# Patient Record
Sex: Female | Born: 1970
Health system: Southern US, Community
[De-identification: ages and names within clinical notes are randomized; demographics above are authoritative.]

## PROBLEM LIST (undated history)

## (undated) DIAGNOSIS — K219 Gastro-esophageal reflux disease without esophagitis: Secondary | ICD-10-CM

## (undated) DIAGNOSIS — IMO0002 Reserved for concepts with insufficient information to code with codable children: Secondary | ICD-10-CM

## (undated) DIAGNOSIS — Q2112 Patent foramen ovale: Secondary | ICD-10-CM

## (undated) DIAGNOSIS — L309 Dermatitis, unspecified: Secondary | ICD-10-CM

## (undated) DIAGNOSIS — R51 Headache: Secondary | ICD-10-CM

## (undated) DIAGNOSIS — B977 Papillomavirus as the cause of diseases classified elsewhere: Secondary | ICD-10-CM

## (undated) DIAGNOSIS — R519 Headache, unspecified: Secondary | ICD-10-CM

## (undated) DIAGNOSIS — Q211 Atrial septal defect: Secondary | ICD-10-CM

## (undated) DIAGNOSIS — E785 Hyperlipidemia, unspecified: Secondary | ICD-10-CM

## (undated) DIAGNOSIS — G8929 Other chronic pain: Secondary | ICD-10-CM

## (undated) DIAGNOSIS — G2581 Restless legs syndrome: Secondary | ICD-10-CM

## (undated) HISTORY — DX: Headache: R51

## (undated) HISTORY — DX: Hyperlipidemia, unspecified: E78.5

## (undated) HISTORY — DX: Other chronic pain: G89.29

## (undated) HISTORY — PX: UPPER GI ENDOSCOPY: SHX6162

## (undated) HISTORY — PX: COLPOSCOPY: SHX161

## (undated) HISTORY — PX: DENTAL SURGERY: SHX609

## (undated) HISTORY — DX: Gastro-esophageal reflux disease without esophagitis: K21.9

## (undated) HISTORY — PX: DOPPLER ECHOCARDIOGRAPHY: SHX263

## (undated) HISTORY — DX: Reserved for concepts with insufficient information to code with codable children: IMO0002

## (undated) HISTORY — DX: Restless legs syndrome: G25.81

## (undated) HISTORY — DX: Dermatitis, unspecified: L30.9

## (undated) HISTORY — DX: Papillomavirus as the cause of diseases classified elsewhere: B97.7

---
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
## 1998-07-28 ENCOUNTER — Other Ambulatory Visit: Admission: RE | Admit: 1998-07-28 | Discharge: 1998-07-28 | Payer: Self-pay | Admitting: Gynecology

## 1999-01-20 ENCOUNTER — Other Ambulatory Visit: Admission: RE | Admit: 1999-01-20 | Discharge: 1999-01-20 | Payer: Self-pay | Admitting: Gynecology

## 2000-03-27 ENCOUNTER — Other Ambulatory Visit: Admission: RE | Admit: 2000-03-27 | Discharge: 2000-03-27 | Payer: Self-pay | Admitting: Gynecology

## 2001-05-09 ENCOUNTER — Emergency Department (HOSPITAL_COMMUNITY): Admission: EM | Admit: 2001-05-09 | Discharge: 2001-05-09 | Payer: Self-pay

## 2002-07-29 ENCOUNTER — Other Ambulatory Visit: Admission: RE | Admit: 2002-07-29 | Discharge: 2002-07-29 | Payer: Self-pay | Admitting: Gynecology

## 2005-03-16 ENCOUNTER — Other Ambulatory Visit: Admission: RE | Admit: 2005-03-16 | Discharge: 2005-03-16 | Payer: Self-pay | Admitting: Gynecology

## 2005-04-11 HISTORY — PX: ENDOMETRIAL ABLATION: SHX621

## 2005-04-22 ENCOUNTER — Ambulatory Visit (HOSPITAL_COMMUNITY): Admission: RE | Admit: 2005-04-22 | Discharge: 2005-04-22 | Payer: Self-pay | Admitting: Gynecology

## 2006-05-12 ENCOUNTER — Other Ambulatory Visit: Admission: RE | Admit: 2006-05-12 | Discharge: 2006-05-12 | Payer: Self-pay | Admitting: Gynecology

## 2007-05-14 ENCOUNTER — Other Ambulatory Visit: Admission: RE | Admit: 2007-05-14 | Discharge: 2007-05-14 | Payer: Self-pay | Admitting: Gynecology

## 2008-05-21 ENCOUNTER — Ambulatory Visit: Payer: Self-pay | Admitting: Gynecology

## 2008-06-12 ENCOUNTER — Other Ambulatory Visit: Admission: RE | Admit: 2008-06-12 | Discharge: 2008-06-12 | Payer: Self-pay | Admitting: Gynecology

## 2008-06-12 ENCOUNTER — Encounter: Payer: Self-pay | Admitting: Gynecology

## 2008-06-12 ENCOUNTER — Ambulatory Visit: Payer: Self-pay | Admitting: Gynecology

## 2009-01-29 ENCOUNTER — Ambulatory Visit (HOSPITAL_COMMUNITY): Admission: RE | Admit: 2009-01-29 | Discharge: 2009-01-29 | Payer: Self-pay | Admitting: Internal Medicine

## 2009-02-02 ENCOUNTER — Encounter: Payer: Self-pay | Admitting: Cardiology

## 2009-02-05 ENCOUNTER — Ambulatory Visit: Payer: Self-pay | Admitting: Cardiology

## 2009-02-05 DIAGNOSIS — E663 Overweight: Secondary | ICD-10-CM | POA: Insufficient documentation

## 2009-02-05 DIAGNOSIS — R0602 Shortness of breath: Secondary | ICD-10-CM

## 2009-02-07 ENCOUNTER — Encounter: Admission: RE | Admit: 2009-02-07 | Discharge: 2009-02-07 | Payer: Self-pay | Admitting: Internal Medicine

## 2009-02-17 ENCOUNTER — Ambulatory Visit (HOSPITAL_COMMUNITY): Admission: RE | Admit: 2009-02-17 | Discharge: 2009-02-17 | Payer: Self-pay | Admitting: Cardiology

## 2009-02-17 ENCOUNTER — Encounter: Payer: Self-pay | Admitting: Cardiology

## 2009-02-17 ENCOUNTER — Ambulatory Visit: Payer: Self-pay | Admitting: Internal Medicine

## 2009-03-31 ENCOUNTER — Ambulatory Visit (HOSPITAL_COMMUNITY): Admission: RE | Admit: 2009-03-31 | Discharge: 2009-03-31 | Payer: Self-pay | Admitting: Diagnostic Neuroimaging

## 2009-03-31 ENCOUNTER — Ambulatory Visit: Payer: Self-pay | Admitting: Cardiology

## 2009-03-31 ENCOUNTER — Ambulatory Visit: Payer: Self-pay

## 2009-03-31 ENCOUNTER — Encounter (INDEPENDENT_AMBULATORY_CARE_PROVIDER_SITE_OTHER): Payer: Self-pay | Admitting: Diagnostic Neuroimaging

## 2009-06-16 ENCOUNTER — Other Ambulatory Visit: Admission: RE | Admit: 2009-06-16 | Discharge: 2009-06-16 | Payer: Self-pay | Admitting: Gynecology

## 2009-06-16 ENCOUNTER — Ambulatory Visit: Payer: Self-pay | Admitting: Gynecology

## 2010-06-18 ENCOUNTER — Other Ambulatory Visit: Payer: Self-pay | Admitting: Gynecology

## 2010-06-18 ENCOUNTER — Other Ambulatory Visit (HOSPITAL_COMMUNITY)
Admission: RE | Admit: 2010-06-18 | Discharge: 2010-06-18 | Disposition: A | Discharge status: Home / Self Care | Payer: 59 | Source: Ambulatory Visit | Attending: Gynecology | Admitting: Gynecology

## 2010-06-18 ENCOUNTER — Encounter (INDEPENDENT_AMBULATORY_CARE_PROVIDER_SITE_OTHER): Payer: 59 | Admitting: Gynecology

## 2010-06-18 DIAGNOSIS — Z124 Encounter for screening for malignant neoplasm of cervix: Secondary | ICD-10-CM | POA: Insufficient documentation

## 2010-06-18 DIAGNOSIS — Z01419 Encounter for gynecological examination (general) (routine) without abnormal findings: Secondary | ICD-10-CM

## 2010-08-27 NOTE — Op Note (Signed)
Kathryn Larsen, Kathryn Larsen              ACCOUNT NO.:  [id number]   MEDICAL RECORD NO.:  [id number]          PATIENT TYPE:  AMB   LOCATION:  SDC                           FACILITY:  [facility]   PHYSICIAN:  Timothy P. Fontaine, M.D.DATE OF BIRTH:  May 11, 1970   DATE OF PROCEDURE:  04/22/2005  DATE OF DISCHARGE:                                 OPERATIVE REPORT   PREOPERATIVE DIAGNOSIS:  Menorrhagia.   POSTOPERATIVE DIAGNOSIS:  Menorrhagia.   PROCEDURES:  1.  NovaSure endometrial ablation.  2.  Hysteroscopy.  3.  Paracervical block with 1% lidocaine.   SURGEON:  Timothy P. Fontaine, M.D.   ANESTHESIA:  MAC with 1% lidocaine paracervical block.   SPECIMEN:  None.   ESTIMATED BLOOD LOSS:  Minimal.   SORBITOL DISCREPANCY:  Minimal.   COMPLICATIONS:  None.   FINDINGS:  Postablative hysteroscopy shows the cavity to be normal, good  distension, no evidence of perforation, with a uniform ablation.   PROCEDURE:  The patient was taken to the operating room, underwent MAC  anesthesia, was placed in the low dorsal lithotomy position, received a  perineal and vaginal preparation with Betadine solution per nursing  personnel, and the bladder was emptied with in-and-out Foley  catheterization.  EUA was performed.  The patient was draped in the usual  fashion.  The cervix visualized with the speculum, anterior lip grasped with  a single-tooth tenaculum and a paracervical block using 1% lidocaine was  placed with a total of 10 mL.  The uterus was then sounded to 7.5 cm.  The  endocervical canal length was determined at 3 cm and the cervix was gently  dilated to admit the NovaSure endometrial ablator.  The NovaSure was placed,  the array opened, width determined at approximately 3.2 cm.  Manipulation  was performed to assure proper placement.  The carbon dioxide tester sleeve  was advanced, applied to the cervix, and the carbon dioxide test performed.  Subsequently the endometrial ablation was  performed without difficulty and  the instrument was subsequently closed and removed.  Hysteroscopy was then  performed showing a  normal empty cavity, good distension, no evidence of perforation, with a  uniform endometrial ablation.  Instruments were removed.  Adequate  hemostasis visualized.  The patient placed in supine position, awakened  without difficulty, taken to the recovery room in good condition, having  tolerated procedure the well.      Timothy P. Fontaine, M.D.  Electronically Signed     TPF/MEDQ  D:  04/22/2005  T:  04/22/2005  Job:  [id number]

## 2010-08-27 NOTE — H&P (Signed)
Kathryn Larsen, Kathryn Larsen              ACCOUNT NO.:  [id number]   MEDICAL RECORD NO.:  [id number]          PATIENT TYPE:  AMB   LOCATION:  SDC                           [facility]ACILITY:  [facility]   PHYSICIAN:  Timothy P. Fontaine, M.D.DATE OF BIRTH:  [birth year]   DATE OF ADMISSION:  DATE OF DISCHARGE:                                HISTORY & PHYSICAL   CHIEF COMPLAINT:  Menorrhagia.   HISTORY OF PRESENT ILLNESS:  This is a 40 year old G2, P2 female, vasectomy  birth control, who has increasing menorrhagia for endometrial ablation.  Preoperative evaluation includes a negative sonohysterogram and negative  thyroid functions.   PAST MEDICAL HISTORY:  Uncomplicated.   PAST SURGICAL HISTORY:  Cesarean section.   ALLERGIES:  No medication allergies.   REVIEW OF SYSTEMS:  Noncontributory.   FAMILY HISTORY:  Noncontributory.   SOCIAL HISTORY:  Noncontributory.   PHYSICAL EXAMINATION:  VITAL SIGNS:  Afebrile.  Vital signs are stable.  HEENT:  Normal.  LUNGS:  Clear.  CARDIAC:  Regular rate without rubs, murmurs, or gallops.  ABDOMEN:  Benign.  PELVIC:  External BUS, vagina normal.  Cervix normal.  Uterus normal size,  midline, immobile.  Adnexa without masses or tenderness.   ASSESSMENT:  A 40 year old G2, P2 female, vasectomy birth control,  increasing menorrhagia, for endometrial ablation.  I reviewed with the  patient and her husband what is involved with the procedure.  She  understands the need for absolute irreversible sterility.  She should never  achieve pregnancy following this and since she is using her husband's  vasectomy, if she is exposed to a different partner, that she should assure  absolute contraception.  She also understands that this is machine  dependent, if there is a malfunction in the machine, that we may not be able  to complete the ablation at this time.  Long term issues with ablation were  reviewed.  She understands she must continue routine follow-ups for  cervical  disease as well as endometrial carcinoma monitoring.  She understands there  are no guarantees of menorrhagia relief.  Her bleeding may continue, change,  or worsen following the procedure and she understands and accepts this.  I  reviewed what is involved with the procedure to include the expected  intraoperative and postoperative courses, the risks of infection, prolonged  antibiotics, the risk of hemorrhage, necessitating transfusion, and the  risks of transfusion were discussed, understood, and accepted.  The risks of  internal organ damage, both directly with perforation or thermally from the  instrument, to include bowel,  bladder, ureters, vessels, and nerves, necessitating major exploratory  reparative surgeries and future reparative surgeries, including ostomy  formation, was all discussed, understood, and accepted.  The patient's  questions were answered to her satisfaction and she is ready to proceed with  surgery.      Timothy P. Fontaine, M.D.  Electronically Signed     TPF/MEDQ  D:  04/18/2005  T:  04/18/2005  Job:  [id number]

## 2011-01-27 ENCOUNTER — Encounter: Payer: Self-pay | Admitting: Anesthesiology

## 2011-01-27 DIAGNOSIS — K219 Gastro-esophageal reflux disease without esophagitis: Secondary | ICD-10-CM | POA: Insufficient documentation

## 2011-01-31 ENCOUNTER — Ambulatory Visit (INDEPENDENT_AMBULATORY_CARE_PROVIDER_SITE_OTHER): Payer: 59 | Admitting: Gynecology

## 2011-01-31 ENCOUNTER — Encounter: Payer: Self-pay | Admitting: Gynecology

## 2011-01-31 DIAGNOSIS — Z23 Encounter for immunization: Secondary | ICD-10-CM

## 2011-01-31 DIAGNOSIS — R6882 Decreased libido: Secondary | ICD-10-CM

## 2011-01-31 DIAGNOSIS — R4586 Emotional lability: Secondary | ICD-10-CM

## 2011-01-31 DIAGNOSIS — F39 Unspecified mood [affective] disorder: Secondary | ICD-10-CM

## 2011-01-31 MED ORDER — FLUOXETINE HCL 20 MG PO CAPS: 20.0000 mg | ORAL_CAPSULE | Freq: Every day | ORAL | Status: DC

## 2011-01-31 NOTE — Progress Notes (Signed)
Addended by: Dayna Barker on: 01/31/2011 12:08 PM   Modules accepted: Orders

## 2011-01-31 NOTE — Progress Notes (Signed)
Patient presents complaining of 6 months of "patient short fuse". She notes anxiety quick to anger feeling PMS type symptoms. She is amenorrheic from her ablation. Does note some hot flashes although not consistent. Some vaginal dryness with intercourse but again not consistent. Also complaining of decreased libido. Has gained some weight but admits to not exercising. No skin hair changes.  Exam HEENT normal with thyroid palpation normal Lungs clear Cardiac regular rate no rubs murmurs or gallops pulse regular.  Assessment and plan: Mood changes decreased libido. I discussed need for increased exercise to help with weight changes as well as overall feeling of well-being. We'll check baseline TSH FSH. Discussed options for management include anxiolytic medication. Patient's interested in trying. I prescribed fluoxetine 20 mg daily refill x1 year. I discussed the side effect profile as well as risk of suicide ideation all of which she understands and accepts. She will follow up for her labs as well as how she's doing when she starts the medication. And then ultimately will follow up when she is due for her annual exam.

## 2011-02-03 NOTE — Progress Notes (Signed)
Addended by: Dara Lords on: 02/03/2011 08:47 AM   Modules accepted: Orders

## 2011-02-04 ENCOUNTER — Other Ambulatory Visit: Payer: 59 | Admitting: *Deleted

## 2011-02-04 DIAGNOSIS — R4586 Emotional lability: Secondary | ICD-10-CM

## 2011-02-04 LAB — T4, FREE: Free T4: 1.17 ng/dL (ref 0.80–1.80)

## 2011-04-25 ENCOUNTER — Encounter: Payer: Self-pay | Admitting: Gastroenterology

## 2011-04-25 ENCOUNTER — Ambulatory Visit (INDEPENDENT_AMBULATORY_CARE_PROVIDER_SITE_OTHER): Payer: 59 | Admitting: Gastroenterology

## 2011-04-25 DIAGNOSIS — K219 Gastro-esophageal reflux disease without esophagitis: Secondary | ICD-10-CM

## 2011-04-25 DIAGNOSIS — R1310 Dysphagia, unspecified: Secondary | ICD-10-CM | POA: Insufficient documentation

## 2011-04-25 NOTE — Progress Notes (Signed)
History of Present Illness: Kathryn Larsen is a pleasant 41 year old white female referred at the request of Dr. Katrinka Blazing for evaluation of reflux. For at least 3 years she's been suffering from intermittent chest burning discomfort. Despite taking multiple medications including PPIs she continues to be symptomatic. She describes pyrosis with burning that radiates into her neck. This occurs primarily during the day. She occasionally has dysphagia to solids. She is on no regular gastric irritants including nonsteroidals.    Past Medical History  Diagnosis Date  . GERD (gastroesophageal reflux disease)   . Hyperlipemia   . Chronic headaches    Past Surgical History  Procedure Date  . Cesarean section   . Endometrial ablation 04/2005    NOVASURE   family history includes Diabetes in her paternal grandmother; Heart disease in her paternal grandmother; and Hypertension in her mother and paternal grandmother.  There is no history of Colon cancer. Current Outpatient Prescriptions  Medication Sig Dispense Refill  . AMBULATORY NON FORMULARY MEDICATION Red Yeast Dye Takes as directed      . Ascorbic Acid (VITAMIN C) 1000 MG tablet Take 1,000 mg by mouth daily.        . calcium carbonate (OS-CAL) 600 MG TABS Take 600 mg by mouth 2 (two) times daily with a meal.        . dexlansoprazole (DEXILANT) 60 MG capsule Take 60 mg by mouth daily.      . fish oil-omega-3 fatty acids 1000 MG capsule Take 2 g by mouth daily.        . Flaxseed, Linseed, (FLAX SEEDS PO) Take by mouth as directed.      Marland Kitchen FLUoxetine (PROZAC) 20 MG capsule Take 1 capsule (20 mg total) by mouth daily.  30 capsule  11  . magnesium 30 MG tablet Take 30 mg by mouth 2 (two) times daily.        . Multiple Vitamin (MULTIVITAMIN) tablet Take 1 tablet by mouth daily.        . Ranitidine HCl (ZANTAC PO) Take by mouth as needed.       Allergies as of 04/25/2011  . (No Known Allergies)    reports that she quit smoking about 10 months ago. Her  smoking use included Cigarettes. She has never used smokeless tobacco. She reports that she drinks alcohol. She reports that she does not use illicit drugs.     Review of Systems: Pertinent positive and negative review of systems were noted in the above HPI section. All other review of systems were otherwise negative.  Vital signs were reviewed in today's medical record Physical Exam: General: Well developed , well nourished, no acute distress Head: Normocephalic and atraumatic Eyes:  sclerae anicteric, EOMI Ears: Normal auditory acuity Mouth: No deformity or lesions Neck: Supple, no masses or thyromegaly Lungs: Clear throughout to auscultation Heart: Regular rate and rhythm; no murmurs, rubs or bruits Abdomen: Soft, non tender and non distended. No masses, hepatosplenomegaly or hernias noted. Normal Bowel sounds Rectal:deferred Musculoskeletal: Symmetrical with no gross deformities  Skin: No lesions on visible extremities Pulses:  Normal pulses noted Extremities: No clubbing, cyanosis, edema or deformities noted Neurological: Alert oriented x 4, grossly nonfocal Cervical Nodes:  No significant cervical adenopathy Inguinal Nodes: No significant inguinal adenopathy Psychological:  Alert and cooperative. Normal mood and affect

## 2011-04-25 NOTE — Assessment & Plan Note (Signed)
The patient remains symptomatic despite various medicines including PPIs and H2 receptor antagonists.  Recommendations #1 increased excellent to 60 mg before breakfast and dinner #2 antireflux measures #3 upper endoscopy for further evaluation and to rule out Barrett's esophagus

## 2011-04-25 NOTE — Patient Instructions (Addendum)
Gastroesophageal Reflux Disease, Adult Gastroesophageal reflux disease (GERD) happens when acid from your stomach flows up into the esophagus. When acid comes in contact with the esophagus, the acid causes soreness (inflammation) in the esophagus. Over time, GERD may create small holes (ulcers) in the lining of the esophagus. CAUSES   Increased body weight. This puts pressure on the stomach, making acid rise from the stomach into the esophagus.   Smoking. This increases acid production in the stomach.   Drinking alcohol. This causes decreased pressure in the lower esophageal sphincter (valve or ring of muscle between the esophagus and stomach), allowing acid from the stomach into the esophagus.   Late evening meals and a full stomach. This increases pressure and acid production in the stomach.   A malformed lower esophageal sphincter.  Sometimes, no cause is found. SYMPTOMS   Burning pain in the lower part of the mid-chest behind the breastbone and in the mid-stomach area. This may occur twice a week or more often.   Trouble swallowing.   Sore throat.   Dry cough.   Asthma-like symptoms including chest tightness, shortness of breath, or wheezing.  DIAGNOSIS  Your caregiver may be able to diagnose GERD based on your symptoms. In some cases, X-rays and other tests may be done to check for complications or to check the condition of your stomach and esophagus. TREATMENT  Your caregiver may recommend over-the-counter or prescription medicines to help decrease acid production. Ask your caregiver before starting or adding any new medicines.  HOME CARE INSTRUCTIONS   Change the factors that you can control. Ask your caregiver for guidance concerning weight loss, quitting smoking, and alcohol consumption.   Avoid foods and drinks that make your symptoms worse, such as:   Caffeine or alcoholic drinks.   Chocolate.   Peppermint or mint flavorings.   Garlic and onions.   Spicy foods.     Citrus fruits, such as oranges, lemons, or limes.   Tomato-based foods such as sauce, chili, salsa, and pizza.   Fried and fatty foods.   Avoid lying down for the 3 hours prior to your bedtime or prior to taking a nap.   Eat small, frequent meals instead of large meals.   Wear loose-fitting clothing. Do not wear anything tight around your waist that causes pressure on your stomach.   Raise the head of your bed 6 to 8 inches with wood blocks to help you sleep. Extra pillows will not help.   Only take over-the-counter or prescription medicines for pain, discomfort, or fever as directed by your caregiver.   Do not take aspirin, ibuprofen, or other nonsteroidal anti-inflammatory drugs (NSAIDs).  SEEK IMMEDIATE MEDICAL CARE IF:   You have pain in your arms, neck, jaw, teeth, or back.   Your pain increases or changes in intensity or duration.   You develop nausea, vomiting, or sweating (diaphoresis).   You develop shortness of breath, or you faint.   Your vomit is green, yellow, black, or looks like coffee grounds or blood.   Your stool is red, bloody, or black.  These symptoms could be signs of other problems, such as heart disease, gastric bleeding, or esophageal bleeding. MAKE SURE YOU:   Understand these instructions.   Will watch your condition.   Will get help right away if you are not doing well or get worse.  Document Released: 01/05/2005 Document Revised: 12/08/2010 Document Reviewed: 10/15/2010 [facility] Patient Information 2012 [place], [place]. Your Endoscopy is scheduled on 04/28/2011 at 3:30pm on  the 4th floor Separate instructions given

## 2011-04-25 NOTE — Assessment & Plan Note (Signed)
Rule out early esophageal stricture  Recommendations #1 upper endoscopy with dilatation as indicated 

## 2011-04-28 ENCOUNTER — Encounter: Payer: 59 | Admitting: Gastroenterology

## 2011-04-28 ENCOUNTER — Ambulatory Visit (AMBULATORY_SURGERY_CENTER): Payer: 59 | Admitting: Gastroenterology

## 2011-04-28 ENCOUNTER — Encounter: Payer: Self-pay | Admitting: Gastroenterology

## 2011-04-28 DIAGNOSIS — R1310 Dysphagia, unspecified: Secondary | ICD-10-CM

## 2011-04-28 MED ORDER — SODIUM CHLORIDE 0.9 % IV SOLN: 500.0000 mL | INTRAVENOUS | Status: DC

## 2011-04-28 NOTE — Op Note (Signed)
[facility] [address] [place], [place]  [address]  ENDOSCOPY PROCEDURE REPORT  PATIENT:  Kathryn Larsen, Kathryn Larsen  MR#:  [id number] BIRTHDATE:  [birth year], 40 yrs. old  GENDER:  female  ENDOSCOPIST:  Barbette Hair. Arlyce Dice, MD Referred by:  PROCEDURE DATE:  04/28/2011 PROCEDURE:  EGD, diagnostic 43235 ASA CLASS:  Class I INDICATIONS:  GERD  MEDICATIONS:   MAC sedation, administered by CRNA propofol 160mg IV, glycopyrrolate (Robinal) 0.2 mg IV, 0.6cc simethancone 0.6 cc PO TOPICAL ANESTHETIC:  DESCRIPTION OF PROCEDURE:   After the risks and benefits of the procedure were explained, informed consent was obtained.  The [facility] [id number] endoscope was introduced through the mouth and advanced to the third portion of the duodenum.  The instrument was slowly withdrawn as the mucosa was fully examined. <<PROCEDUREIMAGES>>  The upper, middle, and distal third of the esophagus were carefully inspected and no abnormalities were noted. The z-line was well seen at the GEJ. The endoscope was pushed into the fundus which was normal including a retroflexed view. The antrum,gastric body, first and second part of the duodenum were unremarkable (see image1, image2, and image3).    Retroflexed views revealed no abnormalities.    The scope was then withdrawn from the patient and the procedure completed.  COMPLICATIONS:  None  ENDOSCOPIC IMPRESSION: 1) Normal EGD RECOMMENDATIONS: 1) continue PPI - dexilant twice a day for 2 weeks then decrease to daily 2) Call office next 2-3 days to schedule an office appointment for 6 weeks  ______________________________ Barbette Hair. Arlyce Dice, MD  CC:  Loree Fee PA  n. eSIGNED:   Barbette Hair. Kaplan at 04/28/2011 04:08 PM  Quentin Cornwall, [phone]

## 2011-04-28 NOTE — Patient Instructions (Addendum)
Increase dexilant to two times a day for two weeks then go back to once a day.                                                                                                                                                                                                                                                                                                                                                        Please follow all discharge instructions given to you by the recovery room nurse. If you have any questions or problems after discharge please call [phone]. You will receive a phone call in the am to see how you are doing and answer any questions you may have. Thank you for choosing [facility] for your health care needs.

## 2011-04-28 NOTE — Progress Notes (Signed)
Patient did not have preoperative order for IV antibiotic SSI prophylaxis. (G8918)  Patient did not experience any of the following events: a burn prior to discharge; a fall within the facility; wrong site/side/patient/procedure/implant event; or a hospital transfer or hospital admission upon discharge from the facility. (G8907)  

## 2011-04-29 ENCOUNTER — Telehealth: Payer: Self-pay | Admitting: *Deleted

## 2011-04-29 NOTE — Telephone Encounter (Signed)

## 2011-06-06 ENCOUNTER — Ambulatory Visit: Payer: 59 | Admitting: Gastroenterology

## 2011-06-16 ENCOUNTER — Other Ambulatory Visit: Payer: Self-pay | Admitting: Gynecology

## 2011-06-16 DIAGNOSIS — Z1231 Encounter for screening mammogram for malignant neoplasm of breast: Secondary | ICD-10-CM

## 2011-07-06 ENCOUNTER — Ambulatory Visit
Admission: RE | Admit: 2011-07-06 | Discharge: 2011-07-06 | Disposition: A | Discharge status: Home / Self Care | Payer: 59 | Source: Ambulatory Visit | Attending: Gynecology | Admitting: Gynecology

## 2011-07-06 DIAGNOSIS — Z1231 Encounter for screening mammogram for malignant neoplasm of breast: Secondary | ICD-10-CM

## 2011-07-11 ENCOUNTER — Ambulatory Visit (INDEPENDENT_AMBULATORY_CARE_PROVIDER_SITE_OTHER): Payer: 59 | Admitting: Gynecology

## 2011-07-11 ENCOUNTER — Encounter: Payer: Self-pay | Admitting: Gynecology

## 2011-07-11 VITALS — BP 110/64 | Ht 66.0 in | Wt 168.0 lb

## 2011-07-11 DIAGNOSIS — L309 Dermatitis, unspecified: Secondary | ICD-10-CM | POA: Insufficient documentation

## 2011-07-11 DIAGNOSIS — Z01419 Encounter for gynecological examination (general) (routine) without abnormal findings: Secondary | ICD-10-CM

## 2011-07-11 DIAGNOSIS — R4586 Emotional lability: Secondary | ICD-10-CM

## 2011-07-11 DIAGNOSIS — F39 Unspecified mood [affective] disorder: Secondary | ICD-10-CM

## 2011-07-11 MED ORDER — FLUOXETINE HCL 20 MG PO CAPS: 20.0000 mg | ORAL_CAPSULE | Freq: Every day | ORAL | Status: DC

## 2011-07-11 NOTE — Patient Instructions (Signed)
Follow up in one year for her annual gynecologic exam. 

## 2011-07-11 NOTE — Progress Notes (Signed)
Kathryn Larsen 09/09/1970 [id number]        41 y.o.  for annual exam.  Doing well no complaints.  Past medical history,surgical history, medications, allergies, family history and social history were all reviewed and documented in the EPIC chart. ROS:  Was performed and pertinent positives and negatives are included in the history.  Exam: Kim chaperone present Filed Vitals:   07/11/11 1105  BP: 110/64   General appearance  Normal Skin grossly normal Head/Neck normal with no cervical or supraclavicular adenopathy thyroid normal Lungs  clear Cardiac RR, without RMG Abdominal  soft, nontender, without masses, organomegaly or hernia Breasts  examined lying and sitting without masses, retractions, discharge or axillary adenopathy. Pelvic  Ext/BUS/vagina  normal   Cervix  normal    Uterus  anteverted, normal size, shape and contour, midline and mobile nontender   Adnexa  Without masses or tenderness    Anus and perineum  normal   Rectovaginal  normal sphincter tone without palpated masses or tenderness.    Assessment/Plan:  41 y.o. female for annual exam.   Vasectomy birth control. Amenorrheic since her NovaSure endometrial ablation. 1. Pap smear. No Pap smear was done today. Last Pap smear 2012. She has numerous normal reports no history of abnormal Paps. I discussed current screening guidelines we'll plan on every three-year Pap smears. 2. Mammography. Patient had her mammogram last month which was normal. She'll continue with annual mammography. SBE monthly reviewed. 3. Health maintenance. All of her blood work is done through her primary physician's office. I did ask her to get a repeat on her thyroid as it was at the lower limits of normal on the TSH was our last check and she agrees to do so. She will see [place] in a year assuming she continues well, sooner as needed.    Dara Lords MD, 11:35 AM 07/11/2011

## 2012-07-31 ENCOUNTER — Other Ambulatory Visit: Payer: Self-pay | Admitting: Gynecology

## 2012-08-14 ENCOUNTER — Other Ambulatory Visit: Payer: Self-pay | Admitting: Gynecology

## 2012-10-10 ENCOUNTER — Other Ambulatory Visit: Payer: Self-pay | Admitting: Gynecology

## 2013-07-22 ENCOUNTER — Ambulatory Visit (INDEPENDENT_AMBULATORY_CARE_PROVIDER_SITE_OTHER): Payer: 59 | Admitting: Gynecology

## 2013-07-22 ENCOUNTER — Encounter: Payer: Self-pay | Admitting: Gynecology

## 2013-07-22 VITALS — BP 112/76 | Ht 66.25 in | Wt 163.6 lb

## 2013-07-22 DIAGNOSIS — N644 Mastodynia: Secondary | ICD-10-CM

## 2013-07-22 DIAGNOSIS — Z01419 Encounter for gynecological examination (general) (routine) without abnormal findings: Secondary | ICD-10-CM

## 2013-07-22 LAB — URINALYSIS W MICROSCOPIC + REFLEX CULTURE
Bilirubin Urine: NEGATIVE
Casts: NONE SEEN
Crystals: NONE SEEN
Glucose, UA: NEGATIVE mg/dL
Ketones, ur: NEGATIVE mg/dL
Nitrite: NEGATIVE
Protein, ur: NEGATIVE mg/dL
Specific Gravity, Urine: >1.03 — ABNORMAL HIGH (ref 1.005–1.030)
Urobilinogen, UA: 0.2 mg/dL (ref 0.0–1.0)
pH: 5.5 (ref 5.0–8.0)

## 2013-07-22 LAB — CBC WITH DIFFERENTIAL/PLATELET
Basophils Absolute: 0 K/uL (ref 0.0–0.1)
Basophils Relative: 0 % (ref 0–1)
Eosinophils Absolute: 0.2 K/uL (ref 0.0–0.7)
Eosinophils Relative: 3 % (ref 0–5)
HCT: 37.7 % (ref 36.0–46.0)
Hemoglobin: 12.6 g/dL (ref 12.0–15.0)
Lymphocytes Relative: 36 % (ref 12–46)
Lymphs Abs: 2.3 K/uL (ref 0.7–4.0)
MCH: 29.9 pg (ref 26.0–34.0)
MCHC: 33.4 g/dL (ref 30.0–36.0)
MCV: 89.3 fL (ref 78.0–100.0)
Monocytes Absolute: 0.5 K/uL (ref 0.1–1.0)
Monocytes Relative: 8 % (ref 3–12)
Neutro Abs: 3.4 K/uL (ref 1.7–7.7)
Neutrophils Relative %: 53 % (ref 43–77)
Platelets: 200 K/uL (ref 150–400)
RBC: 4.22 MIL/uL (ref 3.87–5.11)
RDW: 13.2 % (ref 11.5–15.5)
WBC: 6.5 K/uL (ref 4.0–10.5)

## 2013-07-22 NOTE — Patient Instructions (Signed)
Office will contact you if it looks like you have a urinary tract infection. Call if recurrent urinary tract infections continue to be an issue. Schedule your mammogram. Followup in one year for annual exam, sooner if any issues.  You may obtain a copy of any labs that were done today by logging onto MyChart as outlined in the instructions provided with your AVS (after visit summary). The office will not call with normal lab results but certainly if there are any significant abnormalities then we will contact you.   Health Maintenance, Female A healthy lifestyle and preventative care can promote health and wellness.  Maintain regular health, dental, and eye exams.  Eat a healthy diet. Foods like vegetables, fruits, whole grains, low-fat dairy products, and lean protein foods contain the nutrients you need without too many calories. Decrease your intake of foods high in solid fats, added sugars, and salt. Get information about a proper diet from your caregiver, if necessary.  Regular physical exercise is one of the most important things you can do for your health. Most adults should get at least 150 minutes of moderate-intensity exercise (any activity that increases your heart rate and causes you to sweat) each week. In addition, most adults need muscle-strengthening exercises on 2 or more days a week.   Maintain a healthy weight. The body mass index (BMI) is a screening tool to identify possible weight problems. It provides an estimate of body fat based on height and weight. Your caregiver can help determine your BMI, and can help you achieve or maintain a healthy weight. For adults 20 years and older:  A BMI below 18.5 is considered underweight.  A BMI of 18.5 to 24.9 is normal.  A BMI of 25 to 29.9 is considered overweight.  A BMI of 30 and above is considered obese.  Maintain normal blood lipids and cholesterol by exercising and minimizing your intake of saturated fat. Eat a balanced  diet with plenty of fruits and vegetables. Blood tests for lipids and cholesterol should begin at age 64 and be repeated every 5 years. If your lipid or cholesterol levels are high, you are over 50, or you are a high risk for heart disease, you may need your cholesterol levels checked more frequently.Ongoing high lipid and cholesterol levels should be treated with medicines if diet and exercise are not effective.  If you smoke, find out from your caregiver how to quit. If you do not use tobacco, do not start.  Lung cancer screening is recommended for adults aged 17 80 years who are at high risk for developing lung cancer because of a history of smoking. Yearly low-dose computed tomography (CT) is recommended for people who have at least a 30-pack-year history of smoking and are a current smoker or have quit within the past 15 years. A pack year of smoking is smoking an average of 1 pack of cigarettes a day for 1 year (for example: 1 pack a day for 30 years or 2 packs a day for 15 years). Yearly screening should continue until the smoker has stopped smoking for at least 15 years. Yearly screening should also be stopped for people who develop a health problem that would prevent them from having lung cancer treatment.  If you are pregnant, do not drink alcohol. If you are breastfeeding, be very cautious about drinking alcohol. If you are not pregnant and choose to drink alcohol, do not exceed 1 drink per day. One drink is considered to be 12 ounces (  355 mL) of beer, 5 ounces (148 mL) of wine, or 1.5 ounces (44 mL) of liquor.  Avoid use of street drugs. Do not share needles with anyone. Ask for help if you need support or instructions about stopping the use of drugs.  High blood pressure causes heart disease and increases the risk of stroke. Blood pressure should be checked at least every 1 to 2 years. Ongoing high blood pressure should be treated with medicines, if weight loss and exercise are not  effective.  If you are 10 to 42 years old, ask your caregiver if you should take aspirin to prevent strokes.  Diabetes screening involves taking a blood sample to check your fasting blood sugar level. This should be done once every 3 years, after age 73, if you are within normal weight and without risk factors for diabetes. Testing should be considered at a younger age or be carried out more frequently if you are overweight and have at least 1 risk factor for diabetes.  Breast cancer screening is essential preventative care for women. You should practice "breast self-awareness." This means understanding the normal appearance and feel of your breasts and may include breast self-examination. Any changes detected, no matter how small, should be reported to a caregiver. Women in their 73s and 30s should have a clinical breast exam (CBE) by a caregiver as part of a regular health exam every 1 to 3 years. After age 22, women should have a CBE every year. Starting at age 74, women should consider having a mammogram (breast X-ray) every year. Women who have a family history of breast cancer should talk to their caregiver about genetic screening. Women at a high risk of breast cancer should talk to their caregiver about having an MRI and a mammogram every year.  Breast cancer gene (BRCA)-related cancer risk assessment is recommended for women who have family members with BRCA-related cancers. BRCA-related cancers include breast, ovarian, tubal, and peritoneal cancers. Having family members with these cancers may be associated with an increased risk for harmful changes (mutations) in the breast cancer genes BRCA1 and BRCA2. Results of the assessment will determine the need for genetic counseling and BRCA1 and BRCA2 testing.  The Pap test is a screening test for cervical cancer. Women should have a Pap test starting at age 78. Between ages 71 and 30, Pap tests should be repeated every 2 years. Beginning at age 16,  you should have a Pap test every 3 years as long as the past 3 Pap tests have been normal. If you had a hysterectomy for a problem that was not cancer or a condition that could lead to cancer, then you no longer need Pap tests. If you are between ages 71 and 39, and you have had normal Pap tests going back 10 years, you no longer need Pap tests. If you have had past treatment for cervical cancer or a condition that could lead to cancer, you need Pap tests and screening for cancer for at least 20 years after your treatment. If Pap tests have been discontinued, risk factors (such as a new sexual partner) need to be reassessed to determine if screening should be resumed. Some women have medical problems that increase the chance of getting cervical cancer. In these cases, your caregiver may recommend more frequent screening and Pap tests.  The human papillomavirus (HPV) test is an additional test that may be used for cervical cancer screening. The HPV test looks for the virus that can cause the  cell changes on the cervix. The cells collected during the Pap test can be tested for HPV. The HPV test could be used to screen women aged 18 years and older, and should be used in women of any age who have unclear Pap test results. After the age of 70, women should have HPV testing at the same frequency as a Pap test.  Colorectal cancer can be detected and often prevented. Most routine colorectal cancer screening begins at the age of 67 and continues through age 60. However, your caregiver may recommend screening at an earlier age if you have risk factors for colon cancer. On a yearly basis, your caregiver may provide home test kits to check for hidden blood in the stool. Use of a small camera at the end of a tube, to directly examine the colon (sigmoidoscopy or colonoscopy), can detect the earliest forms of colorectal cancer. Talk to your caregiver about this at age 11, when routine screening begins. Direct examination of  the colon should be repeated every 5 to 10 years through age 80, unless early forms of pre-cancerous polyps or small growths are found.  Hepatitis C blood testing is recommended for all people born from [birth year] through 1964 and any individual with known risks for hepatitis C.  Practice safe sex. Use condoms and avoid high-risk sexual practices to reduce the spread of sexually transmitted infections (STIs). Sexually active women aged 8 and younger should be checked for Chlamydia, which is a common sexually transmitted infection. Older women with new or multiple partners should also be tested for Chlamydia. Testing for other STIs is recommended if you are sexually active and at increased risk.  Osteoporosis is a disease in which the bones lose minerals and strength with aging. This can result in serious bone fractures. The risk of osteoporosis can be identified using a bone density scan. Women ages 55 and over and women at risk for fractures or osteoporosis should discuss screening with their caregivers. Ask your caregiver whether you should be taking a calcium supplement or vitamin D to reduce the rate of osteoporosis.  Menopause can be associated with physical symptoms and risks. Hormone replacement therapy is available to decrease symptoms and risks. You should talk to your caregiver about whether hormone replacement therapy is right for you.  Use sunscreen. Apply sunscreen liberally and repeatedly throughout the day. You should seek shade when your shadow is shorter than you. Protect yourself by wearing long sleeves, pants, a wide-brimmed hat, and sunglasses year round, whenever you are outdoors.  Notify your caregiver of new moles or changes in moles, especially if there is a change in shape or color. Also notify your caregiver if a mole is larger than the size of a pencil eraser.  Stay current with your immunizations. Document Released: 10/11/2010 Document Revised: 07/23/2012 Document Reviewed:  10/11/2010 [facility] Patient Information 2014 [place].

## 2013-07-22 NOTE — Progress Notes (Signed)
Kathryn Larsen 01-01-1971 [id number]        43 y.o.  [id number] for annual exam.  Several issues noted below.  Past medical history,surgical history, problem list, medications, allergies, family history and social history were all reviewed and documented as reviewed in the EPIC chart.  ROS:  12 system ROS performed with pertinent positives and negatives included in the history, assessment and plan.  Included Systems: General, HEENT, Neck, Cardiovascular, Pulmonary, Gastrointestinal, Genitourinary, Musculoskeletal, Dermatologic, Endocrine, Hematological, Neurologic, Psychiatric Additional significant findings : Recurrent post coital urinary infections, intermittent bilateral breast tenderness   Exam: Sharrie Rothman assistant Filed Vitals:   07/22/13 1610  BP: 112/76  Height: 5' 6.25" (1.683 m)  Weight: 163 lb 9.6 oz (74.208 kg)   General appearance:  Normal affect, orientation and appearance. Skin: Grossly normal HEENT: Normal without gross oral lesions, cervical or supraclavicular adenopathy. Thyroid normal.  Ears/nose appear normal Lungs:  Clear without wheezing, rales or rhonchi Cardiac: RR, without RMG Abdominal:  Soft, nontender, without masses, guarding, rebound, organomegaly or hernia Breasts:  Examined lying and sitting without masses, retractions, discharge or axillary adenopathy. Pelvic:  Ext/BUS/vagina normal  Cervix normal. Pap/HPV of cuff done  Uterus anteverted, normal size, shape and contour, midline and mobile nontender   Adnexa  Without masses or tenderness    Anus and perineum  Normal   Rectovaginal  Normal sphincter tone without palpated masses or tenderness.    Assessment/Plan:  43 y.o. G68P1102 female for annual exam amenorrheic, vasectomy birth control.   1. Amenorrhea since NovaSure endometrial ablation. Doing well without menopausal symptoms such as hot flushes or night sweats vaginal dryness or dyspareunia. Will continue to monitor. Report any bleeding. 2. Intermittent  mastalgia. Occurs monthly lasting several days to a week. No abnormalities on SBE nor position exam. I think this is pre-menstrual physiologic discomfort and as she is not having menses from her ablation she does not time this to them. Recommend continued observation. As long as it comes and goes and is bilateral them we will follow. She is overdue for her mammogram I have reminded her to schedule this. SBE monthly reviewed. 3. UTIs. Patient's had several UTIs this past year treated per her primary physician usually after intercourse. Is having a little post void discomfort today. No fever chills frequency urgency. We'll check urinalysis and treat accordingly. If negative then monitor symptoms. If they continue she'll call. If she continues to have frequent postcoital UTIs I discussed possible postcoital prophylaxis with antibiotics one pill with intercourse. She'll let me know if they continue and we will consider this. 4. Pap smear 2012. Pap/HPV today. No history of abnormal Pap smears previously. Continue 3-5 year screening interval per current screening guidelines assuming this Pap smear is normal. 5. Health maintenance. Has not had routine blood drawn in over one year. Baseline CBC comprehensive metabolic panel lipid profile urinalysis ordered. Followup one year, sooner as needed.   Note: This document was prepared with digital dictation and possible smart phrase technology. Any transcriptional errors that result from this process are unintentional.   [name] MD, 4:44 PM 07/22/2013

## 2013-07-23 ENCOUNTER — Other Ambulatory Visit: Payer: Self-pay | Admitting: Gynecology

## 2013-07-23 ENCOUNTER — Other Ambulatory Visit (HOSPITAL_COMMUNITY)
Admission: RE | Admit: 2013-07-23 | Discharge: 2013-07-23 | Disposition: A | Discharge status: Home / Self Care | Payer: 59 | Source: Ambulatory Visit | Attending: Gynecology | Admitting: Gynecology

## 2013-07-23 DIAGNOSIS — Z1151 Encounter for screening for human papillomavirus (HPV): Secondary | ICD-10-CM | POA: Insufficient documentation

## 2013-07-23 DIAGNOSIS — Z124 Encounter for screening for malignant neoplasm of cervix: Secondary | ICD-10-CM | POA: Insufficient documentation

## 2013-07-23 DIAGNOSIS — R87810 Cervical high risk human papillomavirus (HPV) DNA test positive: Secondary | ICD-10-CM | POA: Insufficient documentation

## 2013-07-23 LAB — COMPREHENSIVE METABOLIC PANEL
ALBUMIN: 4 g/dL (ref 3.5–5.2)
ALK PHOS: 45 U/L (ref 39–117)
ALT: 13 U/L (ref 0–35)
AST: 18 U/L (ref 0–37)
BUN: 13 mg/dL (ref 6–23)
CALCIUM: 9 mg/dL (ref 8.4–10.5)
CHLORIDE: 108 meq/L (ref 96–112)
CO2: 26 mEq/L (ref 19–32)
Creat: 0.78 mg/dL (ref 0.50–1.10)
Glucose, Bld: 77 mg/dL (ref 70–99)
POTASSIUM: 4.3 meq/L (ref 3.5–5.3)
SODIUM: 140 meq/L (ref 135–145)
TOTAL PROTEIN: 6.1 g/dL (ref 6.0–8.3)
Total Bilirubin: 0.4 mg/dL (ref 0.2–1.2)

## 2013-07-23 LAB — LIPID PANEL
Cholesterol: 150 mg/dL (ref 0–200)
HDL: 44 mg/dL (ref >39)
LDL CALC: 77 mg/dL (ref 0–99)
Total CHOL/HDL Ratio: 3.4 Ratio
Triglycerides: 144 mg/dL (ref <150)
VLDL: 29 mg/dL (ref 0–40)

## 2013-07-23 MED ORDER — NITROFURANTOIN MONOHYD MACRO 100 MG PO CAPS: ORAL_CAPSULE | ORAL | Status: DC

## 2013-07-23 MED ORDER — NITROFURANTOIN MONOHYD MACRO 100 MG PO CAPS
100.0000 mg | ORAL_CAPSULE | Freq: Two times a day (BID) | ORAL | Status: DC
Start: 2013-07-23 — End: 2013-09-11

## 2013-07-23 NOTE — Addendum Note (Signed)
Addended by: Alen Blew on: 07/23/2013 09:15 AM   Modules accepted: Orders

## 2013-07-24 LAB — URINE CULTURE

## 2013-07-25 ENCOUNTER — Encounter: Payer: Self-pay | Admitting: Gynecology

## 2013-07-30 ENCOUNTER — Encounter: Payer: Self-pay | Admitting: Gynecology

## 2013-09-11 ENCOUNTER — Encounter: Payer: Self-pay | Admitting: Gynecology

## 2013-09-11 ENCOUNTER — Ambulatory Visit (INDEPENDENT_AMBULATORY_CARE_PROVIDER_SITE_OTHER): Payer: 59 | Admitting: Gynecology

## 2013-09-11 DIAGNOSIS — R6889 Other general symptoms and signs: Secondary | ICD-10-CM

## 2013-09-11 DIAGNOSIS — IMO0002 Reserved for concepts with insufficient information to code with codable children: Secondary | ICD-10-CM

## 2013-09-11 DIAGNOSIS — R87810 Cervical high risk human papillomavirus (HPV) DNA test positive: Secondary | ICD-10-CM

## 2013-09-11 NOTE — Addendum Note (Signed)
Addended by: Nelva Nay on: 09/11/2013 04:40 PM   Modules accepted: Orders

## 2013-09-11 NOTE — Patient Instructions (Signed)
Office will call you with biopsy results 

## 2013-09-11 NOTE — Progress Notes (Signed)
Patient ID: Kathryn Larsen, female   DOB: 05/14/1970, 43 y.o.   MRN: [id number] Kathryn Larsen 1970-11-27 [id number]        43 y.o.  G2P1102 presents for colposcopy. History of abnormal Pap smear a number of years ago with repeat normal and followed expectantly. Remainder of her Pap smears were normal up until now with most recent Pap smear showing LGSIL with positive high-risk HPV screen.  Past medical history,surgical history, problem list, medications, allergies, family history and social history were all reviewed and documented in the EPIC chart.  Directed ROS with pertinent positives and negatives documented in the history of present illness/assessment and plan.  Exam: Kim assistant General appearance  Normal Pelvic external BUS vagina normal. Cervix grossly normal.  Colposcopy after acetic acid cleanse is adequate showing a large transformation zone. No abnormalities seen. ECC performed Physical Exam  Genitourinary:       Assessment/Plan:  43 y.o. [id number] with LGSIL Pap smear and positive high-risk HPV screen. Colposcopy is adequate normal. ECC performed. Patient will followup for results. Assuming negative exam plan Pap smear in one year. Otherwise then we'll triage based on results.   Note: This document was prepared with digital dictation and possible smart phrase technology. Any transcriptional errors that result from this process are unintentional.   [name] MD, 4:30 PM 09/11/2013

## 2013-12-19 ENCOUNTER — Ambulatory Visit: Payer: Self-pay | Admitting: Physician Assistant

## 2014-01-27 ENCOUNTER — Encounter: Payer: Self-pay | Admitting: Gynecology

## 2014-02-10 ENCOUNTER — Encounter: Payer: Self-pay | Admitting: Gynecology

## 2014-12-17 ENCOUNTER — Encounter: Payer: Self-pay | Admitting: Physician Assistant

## 2014-12-17 ENCOUNTER — Ambulatory Visit (INDEPENDENT_AMBULATORY_CARE_PROVIDER_SITE_OTHER): Payer: 59 | Admitting: Physician Assistant

## 2014-12-17 VITALS — BP 108/60 | HR 66 | Temp 97.9°F | Resp 16 | Ht 66.25 in | Wt 181.8 lb

## 2014-12-17 DIAGNOSIS — E559 Vitamin D deficiency, unspecified: Secondary | ICD-10-CM

## 2014-12-17 DIAGNOSIS — R0602 Shortness of breath: Secondary | ICD-10-CM

## 2014-12-17 DIAGNOSIS — Q2112 Patent foramen ovale: Secondary | ICD-10-CM | POA: Insufficient documentation

## 2014-12-17 DIAGNOSIS — Q211 Atrial septal defect: Secondary | ICD-10-CM

## 2014-12-17 DIAGNOSIS — K219 Gastro-esophageal reflux disease without esophagitis: Secondary | ICD-10-CM

## 2014-12-17 DIAGNOSIS — Z79899 Other long term (current) drug therapy: Secondary | ICD-10-CM | POA: Diagnosis not present

## 2014-12-17 DIAGNOSIS — E663 Overweight: Secondary | ICD-10-CM | POA: Diagnosis not present

## 2014-12-17 DIAGNOSIS — R5383 Other fatigue: Secondary | ICD-10-CM

## 2014-12-17 LAB — TSH: TSH: 1.453 u[IU]/mL (ref 0.350–4.500)

## 2014-12-17 LAB — CBC WITH DIFFERENTIAL/PLATELET
Basophils Absolute: 0 10*3/uL (ref 0.0–0.1)
Basophils Relative: 0 % (ref 0–1)
EOS PCT: 2 % (ref 0–5)
Eosinophils Absolute: 0.2 10*3/uL (ref 0.0–0.7)
HEMATOCRIT: 40.3 % (ref 36.0–46.0)
HEMOGLOBIN: 13.8 g/dL (ref 12.0–15.0)
LYMPHS ABS: 2.2 10*3/uL (ref 0.7–4.0)
LYMPHS PCT: 28 % (ref 12–46)
MCH: 31 pg (ref 26.0–34.0)
MCHC: 34.2 g/dL (ref 30.0–36.0)
MCV: 90.6 fL (ref 78.0–100.0)
MONO ABS: 0.5 10*3/uL (ref 0.1–1.0)
MONOS PCT: 6 % (ref 3–12)
MPV: 11.7 fL (ref 8.6–12.4)
NEUTROS ABS: 4.9 10*3/uL (ref 1.7–7.7)
Neutrophils Relative %: 64 % (ref 43–77)
Platelets: 223 10*3/uL (ref 150–400)
RBC: 4.45 MIL/uL (ref 3.87–5.11)
RDW: 13.2 % (ref 11.5–15.5)
WBC: 7.7 10*3/uL (ref 4.0–10.5)

## 2014-12-17 LAB — IRON AND TIBC
%SAT: 26 % (ref 11–50)
Iron: 81 ug/dL (ref 40–190)
TIBC: 314 ug/dL (ref 250–450)
UIBC: 233 ug/dL (ref 125–400)

## 2014-12-17 LAB — BASIC METABOLIC PANEL WITH GFR
BUN: 16 mg/dL (ref 7–25)
CALCIUM: 9.5 mg/dL (ref 8.6–10.2)
CO2: 28 mmol/L (ref 20–31)
Chloride: 103 mmol/L (ref 98–110)
Creat: 0.84 mg/dL (ref 0.50–1.10)
GFR, Est Non African American: 85 mL/min (ref >=60)
Glucose, Bld: 83 mg/dL (ref 65–99)
Potassium: 4.3 mmol/L (ref 3.5–5.3)
Sodium: 140 mmol/L (ref 135–146)

## 2014-12-17 LAB — HEPATIC FUNCTION PANEL
ALBUMIN: 4.4 g/dL (ref 3.6–5.1)
ALT: 17 U/L (ref 6–29)
AST: 17 U/L (ref 10–30)
Alkaline Phosphatase: 57 U/L (ref 33–115)
BILIRUBIN INDIRECT: 0.4 mg/dL (ref 0.2–1.2)
Bilirubin, Direct: 0.1 mg/dL (ref <=0.2)
Total Bilirubin: 0.5 mg/dL (ref 0.2–1.2)
Total Protein: 6.7 g/dL (ref 6.1–8.1)

## 2014-12-17 LAB — VITAMIN B12: VITAMIN B 12: 420 pg/mL (ref 211–911)

## 2014-12-17 LAB — FERRITIN: FERRITIN: 47 ng/mL (ref 10–291)

## 2014-12-17 LAB — MAGNESIUM: MAGNESIUM: 2 mg/dL (ref 1.5–2.5)

## 2014-12-17 MED ORDER — ALBUTEROL SULFATE HFA 108 (90 BASE) MCG/ACT IN AERS: 1.0000 | INHALATION_SPRAY | Freq: Four times a day (QID) | RESPIRATORY_TRACT | Status: DC | PRN

## 2014-12-17 MED ORDER — OMEPRAZOLE 40 MG PO CPDR: 40.0000 mg | DELAYED_RELEASE_CAPSULE | Freq: Every day | ORAL | Status: DC

## 2014-12-17 NOTE — Patient Instructions (Signed)
Gastroesophageal Reflux Disease, Adult Gastroesophageal reflux disease (GERD) happens when acid from your stomach flows up into the esophagus. When acid comes in contact with the esophagus, the acid causes soreness (inflammation) in the esophagus. Over time, GERD may create small holes (ulcers) in the lining of the esophagus. CAUSES   Increased body weight. This puts pressure on the stomach, making acid rise from the stomach into the esophagus.  Smoking. This increases acid production in the stomach.  Drinking alcohol. This causes decreased pressure in the lower esophageal sphincter (valve or ring of muscle between the esophagus and stomach), allowing acid from the stomach into the esophagus.  Late evening meals and a full stomach. This increases pressure and acid production in the stomach.  A malformed lower esophageal sphincter. Sometimes, no cause is found. SYMPTOMS   Burning pain in the lower part of the mid-chest behind the breastbone and in the mid-stomach area. This may occur twice a week or more often.  Trouble swallowing.  Sore throat.  Dry cough.  Asthma-like symptoms including chest tightness, shortness of breath, or wheezing. DIAGNOSIS  Your caregiver may be able to diagnose GERD based on your symptoms. In some cases, X-rays and other tests may be done to check for complications or to check the condition of your stomach and esophagus. TREATMENT  Your caregiver may recommend over-the-counter or prescription medicines to help decrease acid production. Ask your caregiver before starting or adding any new medicines.  HOME CARE INSTRUCTIONS   Change the factors that you can control. Ask your caregiver for guidance concerning weight loss, quitting smoking, and alcohol consumption.  Avoid foods and drinks that make your symptoms worse, such as:  Caffeine or alcoholic drinks.  Chocolate.  Peppermint or mint flavorings.  Garlic and onions.  Spicy foods.  Citrus fruits,  such as oranges, lemons, or limes.  Tomato-based foods such as sauce, chili, salsa, and pizza.  Fried and fatty foods.  Avoid lying down for the 3 hours prior to your bedtime or prior to taking a nap.  Eat small, frequent meals instead of large meals.  Wear loose-fitting clothing. Do not wear anything tight around your waist that causes pressure on your stomach.  Raise the head of your bed 6 to 8 inches with wood blocks to help you sleep. Extra pillows will not help.  Only take over-the-counter or prescription medicines for pain, discomfort, or fever as directed by your caregiver.  Do not take aspirin, ibuprofen, or other nonsteroidal anti-inflammatory drugs (NSAIDs). SEEK IMMEDIATE MEDICAL CARE IF:   You have pain in your arms, neck, jaw, teeth, or back.  Your pain increases or changes in intensity or duration.  You develop nausea, vomiting, or sweating (diaphoresis).  You develop shortness of breath, or you faint.  Your vomit is green, yellow, black, or looks like coffee grounds or blood.  Your stool is red, bloody, or black. These symptoms could be signs of other problems, such as heart disease, gastric bleeding, or esophageal bleeding. MAKE SURE YOU:   Understand these instructions.  Will watch your condition.  Will get help right away if you are not doing well or get worse. Document Released: 01/05/2005 Document Revised: 06/20/2011 Document Reviewed: 10/15/2010 [facility] Patient Information 2015 [place], [place]. This information is not intended to replace advice given to you by your health care provider. Make sure you discuss any questions you have with your health care provider.  Shortness of Breath Shortness of breath means you have trouble breathing. It could also mean  that you have a medical problem. You should get immediate medical care for shortness of breath. CAUSES   Not enough oxygen in the air such as with high altitudes or a smoke-filled room.  Certain  lung diseases, infections, or problems.  Heart disease or conditions, such as angina or heart failure.  Low red blood cells (anemia).  Poor physical fitness, which can cause shortness of breath when you exercise.  Chest or back injuries or stiffness.  Being overweight.  Smoking.  Anxiety, which can make you feel like you are not getting enough air. DIAGNOSIS  Serious medical problems can often be found during your physical exam. Tests may also be done to determine why you are having shortness of breath. Tests may include:  Chest X-rays.  Lung function tests.  Blood tests.  An electrocardiogram (ECG).  An ambulatory electrocardiogram. An ambulatory ECG records your heartbeat patterns over a 24-hour period.  Exercise testing.  A transthoracic echocardiogram (TTE). During echocardiography, sound waves are used to evaluate how blood flows through your heart.  A transesophageal echocardiogram (TEE).  Imaging scans. Your health care provider may not be able to find a cause for your shortness of breath after your exam. In this case, it is important to have a follow-up exam with your health care provider as directed.  TREATMENT  Treatment for shortness of breath depends on the cause of your symptoms and can vary greatly. HOME CARE INSTRUCTIONS   Do not smoke. Smoking is a common cause of shortness of breath. If you smoke, ask for help to quit.  Avoid being around chemicals or things that may bother your breathing, such as paint fumes and dust.  Rest as needed. Slowly resume your usual activities.  If medicines were prescribed, take them as directed for the full length of time directed. This includes oxygen and any inhaled medicines.  Keep all follow-up appointments as directed by your health care provider. SEEK MEDICAL CARE IF:   Your condition does not improve in the time expected.  You have a hard time doing your normal activities even with rest.  You have any new  symptoms. SEEK IMMEDIATE MEDICAL CARE IF:   Your shortness of breath gets worse.  You feel light-headed, faint, or develop a cough not controlled with medicines.  You start coughing up blood.  You have pain with breathing.  You have chest pain or pain in your arms, shoulders, or abdomen.  You have a fever.  You are unable to walk up stairs or exercise the way you normally do. MAKE SURE YOU:  Understand these instructions.  Will watch your condition.  Will get help right away if you are not doing well or get worse. Document Released: 12/21/2000 Document Revised: 04/02/2013 Document Reviewed: 06/13/2011 [facility] Patient Information 2015 [place], [place]. This information is not intended to replace advice given to you by your health care provider. Make sure you discuss any questions you have with your health care provider.

## 2014-12-17 NOTE — Progress Notes (Signed)
Assessment and Plan:  1. Hypertension -Continue medication, monitor blood pressure at home. Continue DASH diet.  Reminder to go to the ER if any CP, SOB, nausea, dizziness, severe HA, changes vision/speech, left arm numbness and tingling and jaw pain.  2. Cholesterol -Continue diet and exercise.   3. SOB Lungs CTAB- ? Allergies, GERD, overweight- will send in albuterol to use PRN, get on prilosec, increase walking, check labs, rule out dehydration, anemia, etc, if any worsening symptoms go to ER, if not better 2 weeks follow up here in the office.   4. Vitamin D Def - check level and continue medications.   Continue diet and meds as discussed. Further disposition pending results of labs. Over 30 minutes of exam, counseling, chart review, and critical decision making was performed  HPI 44 y.o. female  presents for OVER DUE FOLLOW UP and SOB for hypertension, cholesterol, and vitamin D deficiency. She has not been seen in the office sine 02/2013.  She has been following up with Dr. Phineas Real for abnormal pap.   Her blood pressure has been controlled at home, today their BP is    She does not workout. She denies chest pain,dizziness.  She is not on cholesterol medication and denies myalgias. Her cholesterol is at goal. The cholesterol last visit was:   Lab Results  Component Value Date   CHOL 150 07/22/2013   HDL 44 07/22/2013   LDLCALC 77 07/22/2013   TRIG 144 07/22/2013   CHOLHDL 3.4 07/22/2013  Over the last several weeks she has been having shortness of breath with stairs, walking across the room, can also happen with rest, will feel like she can not get a good breath. She is able to work out without SOB, CP. She has had sharp, catching chest pain sneezing/cough. Denies edema, PND, orthopnea. She will cough with allergies but no more than usual. She is on pepcid, not on dexilant, still has GERD. She takes iburprofen for HA's regularly, no regular alcohol use.  Has had normal echo 2010,  stress test normal, CXR normal in 2010 for SOB.  BMI is Body mass index is 29.11 kg/(m^2)., she is working on diet and exercise. Wt Readings from Last 3 Encounters:  12/17/14 181 lb 12.8 oz (82.464 kg)  07/22/13 163 lb 9.6 oz (74.208 kg)  07/11/11 168 lb (76.204 kg)     Current Medications:  Current Outpatient Prescriptions on File Prior to Visit  Medication Sig Dispense Refill  . Ascorbic Acid (VITAMIN C) 1000 MG tablet Take 1,000 mg by mouth daily.      . calcium carbonate (OS-CAL) 600 MG TABS Take 600 mg by mouth 2 (two) times daily with a meal.      . Cetirizine HCl (ZYRTEC PO) Take by mouth.    . cholecalciferol (VITAMIN D) 1000 UNITS tablet Take 6,000 Units by mouth daily.    Marland Kitchen dexlansoprazole (DEXILANT) 60 MG capsule Take 60 mg by mouth daily.    . fish oil-omega-3 fatty acids 1000 MG capsule Take 2 g by mouth daily.      Marland Kitchen FLUoxetine (PROZAC) 20 MG capsule TAKE ONE CAPSULE EVERY DAY 30 capsule 0  . magnesium 30 MG tablet Take 30 mg by mouth 2 (two) times daily.      . Multiple Vitamin (MULTIVITAMIN) tablet Take 1 tablet by mouth daily.      . nitrofurantoin, macrocrystal-monohydrate, (MACROBID) 100 MG capsule Take one with intercourse to prevent UTI. 30 capsule 1   No current facility-administered medications on  file prior to visit.   Medical History:  Past Medical History  Diagnosis Date  . GERD (gastroesophageal reflux disease)   . Hyperlipemia   . Chronic headaches   . Eczema   . Restless leg   . LGSIL (low grade squamous intraepithelial dysplasia) 07/2013    Follow up colposcopy adequate/normal  ECC negative  . 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   Allergies: No Known Allergies   Review of Systems:  Review of Systems  Constitutional: Positive for malaise/fatigue. Negative for fever, chills, weight loss and diaphoresis.  HENT: Positive for congestion. Negative for ear discharge, ear pain, hearing loss, nosebleeds, sore throat and tinnitus.   Eyes: Negative.   Respiratory:  Positive for cough and shortness of breath. Negative for hemoptysis, sputum production, wheezing and stridor.   Cardiovascular: Negative.  Negative for orthopnea, leg swelling and PND.  Gastrointestinal: Positive for heartburn. Negative for nausea, vomiting, abdominal pain, diarrhea, constipation, blood in stool and melena.  Genitourinary: Negative.   Musculoskeletal: Negative.   Skin: Negative.  Negative for rash.  Neurological: Positive for headaches. Negative for dizziness, tingling, tremors, sensory change, speech change, focal weakness, seizures, loss of consciousness and weakness.  Psychiatric/Behavioral: Negative.     Family history- Review and unchanged Social history- Review and unchanged Physical Exam: There were no vitals taken for this visit. Wt Readings from Last 3 Encounters:  07/22/13 163 lb 9.6 oz (74.208 kg)  07/11/11 168 lb (76.204 kg)  04/28/11 165 lb (74.844 kg)   General Appearance: Well nourished, in no apparent distress. Eyes: PERRLA, EOMs, conjunctiva no swelling or erythema Sinuses: No Frontal/maxillary tenderness ENT/Mouth: Ext aud canals clear, TMs without erythema, bulging. No erythema, swelling, or exudate on post pharynx.  Tonsils not swollen or erythematous. Hearing normal.  Neck: Supple, thyroid normal.  Respiratory: Respiratory effort normal, BS equal bilaterally without rales, rhonchi, wheezing or stridor.  Cardio: RRR with no MRGs. Brisk peripheral pulses without edema.  Abdomen: Soft, + BS,  + epigastric tenderness, no guarding, rebound, hernias, masses. Lymphatics: Non tender without lymphadenopathy.  Musculoskeletal: Full ROM, 5/5 strength, Normal gait Skin: Warm, dry without rashes, lesions, ecchymosis.  Neuro: Cranial nerves intact. Normal muscle tone, no cerebellar symptoms. Psych: Awake and oriented X 3, normal affect, Insight and Judgment appropriate.    Vicie Mutters, PA-C 2:25 PM [facility]

## 2014-12-18 ENCOUNTER — Telehealth: Payer: Self-pay

## 2014-12-18 LAB — VITAMIN D 25 HYDROXY (VIT D DEFICIENCY, FRACTURES): VIT D 25 HYDROXY: 35 ng/mL (ref 30–100)

## 2014-12-18 NOTE — Telephone Encounter (Signed)
Vicie Mutters, PA-C prescribed Omeprazole 40mg . Insurance is not covering Rx. Per Vicie Mutters, PA-C, patient needs to buy Rx OTC. Patient aware.

## 2015-03-19 ENCOUNTER — Encounter: Payer: Self-pay | Admitting: Physician Assistant

## 2015-04-15 ENCOUNTER — Encounter: Payer: Self-pay | Admitting: Physician Assistant

## 2015-05-27 ENCOUNTER — Other Ambulatory Visit: Payer: Self-pay

## 2015-05-27 DIAGNOSIS — Z1231 Encounter for screening mammogram for malignant neoplasm of breast: Secondary | ICD-10-CM

## 2015-06-03 ENCOUNTER — Encounter: Payer: Self-pay | Admitting: Gynecology

## 2015-06-03 ENCOUNTER — Other Ambulatory Visit (HOSPITAL_COMMUNITY)
Admission: RE | Admit: 2015-06-03 | Discharge: 2015-06-03 | Disposition: A | Discharge status: Home / Self Care | Payer: 59 | Source: Ambulatory Visit | Attending: Gynecology | Admitting: Gynecology

## 2015-06-03 ENCOUNTER — Ambulatory Visit (INDEPENDENT_AMBULATORY_CARE_PROVIDER_SITE_OTHER): Payer: 59 | Admitting: Gynecology

## 2015-06-03 VITALS — BP 122/78 | Ht 67.0 in | Wt 180.0 lb

## 2015-06-03 DIAGNOSIS — R102 Pelvic and perineal pain: Secondary | ICD-10-CM

## 2015-06-03 DIAGNOSIS — Z1151 Encounter for screening for human papillomavirus (HPV): Secondary | ICD-10-CM | POA: Diagnosis not present

## 2015-06-03 DIAGNOSIS — R896 Abnormal cytological findings in specimens from other organs, systems and tissues: Secondary | ICD-10-CM

## 2015-06-03 DIAGNOSIS — Z01419 Encounter for gynecological examination (general) (routine) without abnormal findings: Secondary | ICD-10-CM | POA: Diagnosis not present

## 2015-06-03 DIAGNOSIS — IMO0002 Reserved for concepts with insufficient information to code with codable children: Secondary | ICD-10-CM

## 2015-06-03 DIAGNOSIS — Z01411 Encounter for gynecological examination (general) (routine) with abnormal findings: Secondary | ICD-10-CM | POA: Insufficient documentation

## 2015-06-03 NOTE — Progress Notes (Signed)
LIMA SQUARE 10-08-1970 [id number]        45 y.o.  [id number]  for annual exam.  Several issues noted below  Past medical history,surgical history, problem list, medications, allergies, family history and social history were all reviewed and documented as reviewed in the EPIC chart.  ROS:  Performed with pertinent positives and negatives included in the history, assessment and plan.   Additional significant findings :  none   Exam: [name] assistant Filed Vitals:   06/03/15 1155  BP: 122/78  Height: 5\' 7"  (1.702 m)  Weight: 180 lb (81.647 kg)   General appearance:  Normal affect, orientation and appearance. Skin: Grossly normal HEENT: Without gross lesions.  No cervical or supraclavicular adenopathy. Thyroid normal.  Lungs:  Clear without wheezing, rales or rhonchi Cardiac: RR, without RMG Abdominal:  Soft, nontender, without masses, guarding, rebound, organomegaly or hernia Breasts:  Examined lying and sitting without masses, retractions, discharge or axillary adenopathy. Pelvic:  Ext/BUS/vagina normal  Cervix normal. Pap smear/HPV  Uterus anteverted, normal size, shape and contour, midline and mobile nontender   Adnexa without masses or tenderness    Anus and perineum normal   Rectovaginal normal sphincter tone without palpated masses or tenderness.    Assessment/Plan:  45 y.o. [id number] female for annual exam without menses, vasectomy birth control.   1. Amenorrhea following her [employer] endometrial ablation. No significant symptoms to suggest menopause.  Recent TSH was normal at her primary care physician's office. 2. Fleeting pelvic pain. Patient had an intermittent cramping that feels premenstrual. No bleeding at all. Occurs intermittently throughout the month. Discussed possibilities to include small hematometria's. We'll start with GYN ultrasound. Patient will follow up for this and then we'll go from there. No constipation diarrhea or urinary symptoms such as frequency  dysuria or urgency. Exam is normal today. 3. Pap smear 2015 LGSIL with positive high-risk HPV. Colposcopy was adequate normal with a negative ECC. Pap smear/HPV done today. Triage based on results. 4. Mammogram scheduled today. Continue with annual mammography. SBE monthly reviewed. 5. Health maintenance. No routine blood work done as this is done at her primary physician's office. Follow up for ultrasound.   [name] MD, 12:16 PM 06/03/2015

## 2015-06-03 NOTE — Patient Instructions (Signed)
Follow up for ultrasound as scheduled.  You may obtain a copy of any labs that were done today by logging onto MyChart as outlined in the instructions provided with your AVS (after visit summary). The office will not call with normal lab results but certainly if there are any significant abnormalities then we will contact you.   Health Maintenance Adopting a healthy lifestyle and getting preventive care can go a long way to promote health and wellness. Talk with your health care provider about what schedule of regular examinations is right for you. This is a good chance for you to check in with your provider about disease prevention and staying healthy. In between checkups, there are plenty of things you can do on your own. Experts have done a lot of research about which lifestyle changes and preventive measures are most likely to keep you healthy. Ask your health care provider for more information. WEIGHT AND DIET  Eat a healthy diet  Be sure to include plenty of vegetables, fruits, low-fat dairy products, and lean protein.  Do not eat a lot of foods high in solid fats, added sugars, or salt.  Get regular exercise. This is one of the most important things you can do for your health.  Most adults should exercise for at least 150 minutes each week. The exercise should increase your heart rate and make you sweat (moderate-intensity exercise).  Most adults should also do strengthening exercises at least twice a week. This is in addition to the moderate-intensity exercise.  Maintain a healthy weight  Body mass index (BMI) is a measurement that can be used to identify possible weight problems. It estimates body fat based on height and weight. Your health care provider can help determine your BMI and help you achieve or maintain a healthy weight.  For females 20 years of age and older:   A BMI below 18.5 is considered underweight.  A BMI of 18.5 to 24.9 is normal.  A BMI of 25 to 29.9 is  considered overweight.  A BMI of 30 and above is considered obese.  Watch levels of cholesterol and blood lipids  You should start having your blood tested for lipids and cholesterol at 44 years of age, then have this test every 5 years.  You may need to have your cholesterol levels checked more often if:  Your lipid or cholesterol levels are high.  You are older than 44 years of age.  You are at high risk for heart disease.  CANCER SCREENING   Lung Cancer  Lung cancer screening is recommended for adults 55-80 years old who are at high risk for lung cancer because of a history of smoking.  A yearly low-dose CT scan of the lungs is recommended for people who:  Currently smoke.  Have quit within the past 15 years.  Have at least a 30-pack-year history of smoking. A pack year is smoking an average of one pack of cigarettes a day for 1 year.  Yearly screening should continue until it has been 15 years since you quit.  Yearly screening should stop if you develop a health problem that would prevent you from having lung cancer treatment.  Breast Cancer  Practice breast self-awareness. This means understanding how your breasts normally appear and feel.  It also means doing regular breast self-exams. Let your health care provider know about any changes, no matter how small.  If you are in your 20s or 30s, you should have a clinical breast exam (CBE)   by a health care provider every 1-3 years as part of a regular health exam.  If you are 22 or older, have a CBE every year. Also consider having a breast X-ray (mammogram) every year.  If you have a family history of breast cancer, talk to your health care provider about genetic screening.  If you are at high risk for breast cancer, talk to your health care provider about having an MRI and a mammogram every year.  Breast cancer gene (BRCA) assessment is recommended for women who have family members with BRCA-related cancers.  BRCA-related cancers include:  Breast.  Ovarian.  Tubal.  Peritoneal cancers.  Results of the assessment will determine the need for genetic counseling and BRCA1 and BRCA2 testing. Cervical Cancer Routine pelvic examinations to screen for cervical cancer are no longer recommended for nonpregnant women who are considered low risk for cancer of the pelvic organs (ovaries, uterus, and vagina) and who do not have symptoms. A pelvic examination may be necessary if you have symptoms including those associated with pelvic infections. Ask your health care provider if a screening pelvic exam is right for you.   The Pap test is the screening test for cervical cancer for women who are considered at risk.  If you had a hysterectomy for a problem that was not cancer or a condition that could lead to cancer, then you no longer need Pap tests.  If you are older than 65 years, and you have had normal Pap tests for the past 10 years, you no longer need to have Pap tests.  If you have had past treatment for cervical cancer or a condition that could lead to cancer, you need Pap tests and screening for cancer for at least 20 years after your treatment.  If you no longer get a Pap test, assess your risk factors if they change (such as having a new sexual partner). This can affect whether you should start being screened again.  Some women have medical problems that increase their chance of getting cervical cancer. If this is the case for you, your health care provider may recommend more frequent screening and Pap tests.  The human papillomavirus (HPV) test is another test that may be used for cervical cancer screening. The HPV test looks for the virus that can cause cell changes in the cervix. The cells collected during the Pap test can be tested for HPV.  The HPV test can be used to screen women 39 years of age and older. Getting tested for HPV can extend the interval between normal Pap tests from three to  five years.  An HPV test also should be used to screen women of any age who have unclear Pap test results.  After 44 years of age, women should have HPV testing as often as Pap tests.  Colorectal Cancer  This type of cancer can be detected and often prevented.  Routine colorectal cancer screening usually begins at 44 years of age and continues through 44 years of age.  Your health care provider may recommend screening at an earlier age if you have risk factors for colon cancer.  Your health care provider may also recommend using home test kits to check for hidden blood in the stool.  A small camera at the end of a tube can be used to examine your colon directly (sigmoidoscopy or colonoscopy). This is done to check for the earliest forms of colorectal cancer.  Routine screening usually begins at age 58.  Direct  examination of the colon should be repeated every 5-10 years through 44 years of age. However, you may need to be screened more often if early forms of precancerous polyps or small growths are found. Skin Cancer  Check your skin from head to toe regularly.  Tell your health care provider about any new moles or changes in moles, especially if there is a change in a mole's shape or color.  Also tell your health care provider if you have a mole that is larger than the size of a pencil eraser.  Always use sunscreen. Apply sunscreen liberally and repeatedly throughout the day.  Protect yourself by wearing long sleeves, pants, a wide-brimmed hat, and sunglasses whenever you are outside. HEART DISEASE, DIABETES, AND HIGH BLOOD PRESSURE   Have your blood pressure checked at least every 1-2 years. High blood pressure causes heart disease and increases the risk of stroke.  If you are between 26 years and 34 years old, ask your health care provider if you should take aspirin to prevent strokes.  Have regular diabetes screenings. This involves taking a blood sample to check your  fasting blood sugar level.  If you are at a normal weight and have a low risk for diabetes, have this test once every three years after 44 years of age.  If you are overweight and have a high risk for diabetes, consider being tested at a younger age or more often. PREVENTING INFECTION  Hepatitis B  If you have a higher risk for hepatitis B, you should be screened for this virus. You are considered at high risk for hepatitis B if:  You were born in a country where hepatitis B is common. Ask your health care provider which countries are considered high risk.  Your parents were born in a high-risk country, and you have not been immunized against hepatitis B (hepatitis B vaccine).  You have HIV or AIDS.  You use needles to inject street drugs.  You live with someone who has hepatitis B.  You have had sex with someone who has hepatitis B.  You get hemodialysis treatment.  You take certain medicines for conditions, including cancer, organ transplantation, and autoimmune conditions. Hepatitis C  Blood testing is recommended for:  Everyone born from [birth year] through 1964.  Anyone with known risk factors for hepatitis C. Sexually transmitted infections (STIs)  You should be screened for sexually transmitted infections (STIs) including gonorrhea and chlamydia if:  You are sexually active and are younger than 44 years of age.  You are older than 44 years of age and your health care provider tells you that you are at risk for this type of infection.  Your sexual activity has changed since you were last screened and you are at an increased risk for chlamydia or gonorrhea. Ask your health care provider if you are at risk.  If you do not have HIV, but are at risk, it may be recommended that you take a prescription medicine daily to prevent HIV infection. This is called pre-exposure prophylaxis (PrEP). You are considered at risk if:  You are sexually active and do not regularly use condoms or  know the HIV status of your partner(s).  You take drugs by injection.  You are sexually active with a partner who has HIV. Talk with your health care provider about whether you are at high risk of being infected with HIV. If you choose to begin PrEP, you should first be tested for HIV. You should then be tested  every 3 months for as long as you are taking PrEP.  PREGNANCY   If you are premenopausal and you may become pregnant, ask your health care provider about preconception counseling.  If you may become pregnant, take 400 to 800 micrograms (mcg) of folic acid every day.  If you want to prevent pregnancy, talk to your health care provider about birth control (contraception). OSTEOPOROSIS AND MENOPAUSE   Osteoporosis is a disease in which the bones lose minerals and strength with aging. This can result in serious bone fractures. Your risk for osteoporosis can be identified using a bone density scan.  If you are 67 years of age or older, or if you are at risk for osteoporosis and fractures, ask your health care provider if you should be screened.  Ask your health care provider whether you should take a calcium or vitamin D supplement to lower your risk for osteoporosis.  Menopause may have certain physical symptoms and risks.  Hormone replacement therapy may reduce some of these symptoms and risks. Talk to your health care provider about whether hormone replacement therapy is right for you.  HOME CARE INSTRUCTIONS   Schedule regular health, dental, and eye exams.  Stay current with your immunizations.   Do not use any tobacco products including cigarettes, chewing tobacco, or electronic cigarettes.  If you are pregnant, do not drink alcohol.  If you are breastfeeding, limit how much and how often you drink alcohol.  Limit alcohol intake to no more than 1 drink per day for nonpregnant women. One drink equals 12 ounces of beer, 5 ounces of wine, or 1 ounces of hard liquor.  Do  not use street drugs.  Do not share needles.  Ask your health care provider for help if you need support or information about quitting drugs.  Tell your health care provider if you often feel depressed.  Tell your health care provider if you have ever been abused or do not feel safe at home. Document Released: 10/11/2010 Document Revised: 08/12/2013 Document Reviewed: 02/27/2013 [facility] Patient Information 2015 [place], [place]. This information is not intended to replace advice given to you by your health care provider. Make sure you discuss any questions you have with your health care provider.

## 2015-06-03 NOTE — Addendum Note (Signed)
Addended by: Nelva Nay on: 06/03/2015 12:21 PM   Modules accepted: Orders

## 2015-06-04 LAB — URINALYSIS W MICROSCOPIC + REFLEX CULTURE
Bacteria, UA: NONE SEEN [HPF]
Bilirubin Urine: NEGATIVE
Casts: NONE SEEN [LPF]
Crystals: NONE SEEN [HPF]
GLUCOSE, UA: NEGATIVE
HGB URINE DIPSTICK: NEGATIVE
Ketones, ur: NEGATIVE
NITRITE: NEGATIVE
PROTEIN: NEGATIVE
RBC / HPF: NONE SEEN RBC/HPF (ref <=2)
SPECIFIC GRAVITY, URINE: 1.005 (ref 1.001–1.035)
YEAST: NONE SEEN [HPF]
pH: 7.5 (ref 5.0–8.0)

## 2015-06-05 ENCOUNTER — Ambulatory Visit
Admission: RE | Admit: 2015-06-05 | Discharge: 2015-06-05 | Disposition: A | Discharge status: Home / Self Care | Payer: 59 | Source: Ambulatory Visit

## 2015-06-05 DIAGNOSIS — Z1231 Encounter for screening mammogram for malignant neoplasm of breast: Secondary | ICD-10-CM

## 2015-06-05 LAB — URINE CULTURE

## 2015-06-05 LAB — CYTOLOGY - PAP

## 2015-06-08 ENCOUNTER — Encounter: Payer: Self-pay | Admitting: Gynecology

## 2015-06-12 ENCOUNTER — Other Ambulatory Visit: Payer: Self-pay | Admitting: Gynecology

## 2015-06-12 ENCOUNTER — Ambulatory Visit (INDEPENDENT_AMBULATORY_CARE_PROVIDER_SITE_OTHER): Payer: 59

## 2015-06-12 ENCOUNTER — Ambulatory Visit (INDEPENDENT_AMBULATORY_CARE_PROVIDER_SITE_OTHER): Payer: 59 | Admitting: Gynecology

## 2015-06-12 ENCOUNTER — Encounter: Payer: Self-pay | Admitting: Gynecology

## 2015-06-12 VITALS — BP 118/74

## 2015-06-12 DIAGNOSIS — R935 Abnormal findings on diagnostic imaging of other abdominal regions, including retroperitoneum: Secondary | ICD-10-CM | POA: Diagnosis not present

## 2015-06-12 DIAGNOSIS — R102 Pelvic and perineal pain: Secondary | ICD-10-CM

## 2015-06-12 DIAGNOSIS — N9489 Other specified conditions associated with female genital organs and menstrual cycle: Secondary | ICD-10-CM

## 2015-06-12 DIAGNOSIS — N840 Polyp of corpus uteri: Secondary | ICD-10-CM | POA: Diagnosis not present

## 2015-06-12 NOTE — Patient Instructions (Signed)
Followup for surgery as scheduled. 

## 2015-06-12 NOTE — Progress Notes (Signed)
Kathryn Larsen [birth year] [id number]        45 y.o.  [id number] presents for ultrasound. Having monthly pelvic cramping reminiscent of premenstrual cramps she used to have. She is status post endometrial ablation and initially was having some spotting with menses but has stopped doing that.  Past medical history,surgical history, problem list, medications, allergies, family history and social history were all reviewed and documented in the EPIC chart.  Directed ROS with pertinent positives and negatives documented in the history of present illness/assessment and plan.  Exam: Filed Vitals:   06/12/15 1130  BP: 118/74   General appearance:  Normal  Ultrasound shows uterus normal size and echotexture. Endometrial echo 6.9 mm. Lower uterine segment with fluid outlining an echogenic focus 20 x 5 mm with positive color Doppler flow. Appears to extend into the cervix. Right and left ovaries normal. Cul-de-sac negative.  Assessment/Plan:  45 y.o. [id number] with cramping pelvic discomfort monthly. Ultrasound suggests lower uterine segment polyp. Reviewed with patient that endometrial assessment ultrasonographically is difficult after endometrial ablation and it could be scarring or debris versus polyp. May be acting as a obstruction for outflow leading to her cramping monthly. No significant hematometria. Options for management reviewed and we both ultimately agree with proceeding with hysteroscopy D&C and removal of any encountered abnormalities. She understands that hysteroscopy after endometrial ablations can be difficult and that we may have a suboptimal outcome for removal of this area. We will go ahead and schedule this and she will follow up for a preoperative appointment. I discussed also possibly sonohysterogram first but I think given that we outlined this with fluid already present that this will not add anything other than expense.    [name] MD, 11:42 AM 06/12/2015

## 2015-06-24 ENCOUNTER — Encounter: Payer: Self-pay | Admitting: Gynecology

## 2015-06-26 ENCOUNTER — Ambulatory Visit (INDEPENDENT_AMBULATORY_CARE_PROVIDER_SITE_OTHER): Payer: 59 | Admitting: Gynecology

## 2015-06-26 ENCOUNTER — Encounter: Payer: Self-pay | Admitting: Gynecology

## 2015-06-26 VITALS — BP 124/80

## 2015-06-26 DIAGNOSIS — R87810 Cervical high risk human papillomavirus (HPV) DNA test positive: Secondary | ICD-10-CM

## 2015-06-26 DIAGNOSIS — N840 Polyp of corpus uteri: Secondary | ICD-10-CM | POA: Diagnosis not present

## 2015-06-26 DIAGNOSIS — IMO0002 Reserved for concepts with insufficient information to code with codable children: Secondary | ICD-10-CM

## 2015-06-26 DIAGNOSIS — R896 Abnormal cytological findings in specimens from other organs, systems and tissues: Secondary | ICD-10-CM

## 2015-06-26 NOTE — Progress Notes (Signed)
Kathryn Larsen 1970/07/26 [id number]        45 y.o.  [id number] Presents for colposcopy and preop consult for upcoming hysteroscopy D&C.  History of Pap smear 2015 showing LGSIL with positive high-risk HPV. Colposcopy was adequate and normal with negative ECC. Most recent Pap smear again showed LGSIL with positive high-risk HPV and she is here for colposcopy.  She also has scheduled a hysteroscopy D&C end of this month due to monthly cramping. She does no bleeding but feels cramping that comes each month for several days with recent ultrasound showing the lower uterine segment outlined with fluid and an echogenic focus 20 x 5 mm with positive flow suggesting a polyp. She is status post NovaSure ablation in the past with the upper endometrial cavity obscured.  Past medical history,surgical history, problem list, medications, allergies, family history and social history were all reviewed and documented in the EPIC chart.  Directed ROS with pertinent positives and negatives documented in the history of present illness/assessment and plan.  Exam: [name] assistant Filed Vitals:   06/26/15 1104  BP: 124/80   General appearance:  Normal HEENT normal Lungs clear Cardiac regular rate no rubs murmurs or gallops Abdomen soft nontender without masses guarding rebound Pelvic external BUS vagina normal. Cervix grossly normal. Uterus normal size midline mobile nontender. Adnexa without masses or tenderness.  Colposcopy performed after acetic acid cleanse is adequate with no abnormalities. ECC performed. Physical Exam  Genitourinary:       Assessment/Plan:  45 y.o. [id number] with:  1. Persistent LGSIL Pap smears with positive high-risk HPV. Colposcopy is adequate normal. ECC performed. Assuming negative and plan expectant management. I again had a discussion with her about dysplasia, high-grade/low-grade, progression/regression, the HPV association. Possible need for treatment such as LEEP  discussed it ECC positive.  Possible need for treatments in the future. Patient will follow up for biopsy results. 2. History of monthly cramping but no bleeding after NovaSure endometrial ablation. Ultrasound shows some fluid in the lower uterine segment which is outlining a probable polyp. Will proceed with hysteroscopy D&C with resection of the endometrial polyp or other pathology as encountered. She understands that hysteroscopies following endometrial ablations can be difficult due to scarring and that I will not be overly aggressive for fear of perforation and there are no guarantees that this will alleviate her cramping.  I reviewed the proposed surgery with the patient to include the expected intraoperative and postoperative courses as well as the recovery period. The use of the hysteroscope, resectoscope and the D&C portion were all discussed. The risks of surgery to include infection, prolonged antibiotics, hemorrhage necessitating transfusion and the risks of transfusion, including transfusion reaction, hepatitis, HIV, mad cow disease and other unknown entities were all discussed understood and accepted. The risk of damage to internal organs during the procedure, either immediately recognized or delay recognized, including vagina, cervix, uterus, possible perforation causing damage to bowel, bladder, ureters, vessels and nerves necessitating major exploratory reparative surgery and future reparative surgeries including bladder repair, ureteral damage repair, bowel resection, ostomy formation was also discussed understood and accepted. The potential for distended media absorption leading to metabolic complications such as fluid overload, coma and seizures was also discussed understood and accepted. The patient's questions were answered to her satisfaction and she is ready to proceed with surgery.    [name] MD, 11:17 AM 06/26/2015

## 2015-06-26 NOTE — H&P (Signed)
  Kathryn Larsen 07-25-1970 [id number]   History and Physical  Chief complaint: monthly cramping with lower uterine segment endometrial polyp  History of present illness: 45 y.o. G2P1102 with history of monthly cramping but no bleeding with history of NovaSure endometrial ablation 2007.  Recent ultrasound shows an echogenic focus 20 x 5 mm outlined by fluid in the lower uterine segment with positive flow suggesting a polyp. Patient submitted for hysteroscopy D&C with resection of the endometrial defect. Patient is also being followed expectantly for LGSIL and positive high-risk HPV with recent colposcopy.  Past medical history,surgical history, medications, allergies, family history and social history were all reviewed and documented in the EPIC chart.  ROS:  Was performed and pertinent positives and negatives are included in the history of present illness.  Exam:  Kathryn Larsen assistant 3/17/2 2017 General: well developed, well nourished female, no acute distress HEENT: normal  Lungs: clear to auscultation without wheezing, rales or rhonchi  Cardiac: regular rate without rubs, murmurs or gallops  Abdomen: soft, nontender without masses, guarding, rebound, organomegaly  Pelvic: external bus vagina: normal   Cervix: grossly normal  Uterus: normal size, midline and mobile, nontender  Adnexa: without masses or tenderness     1. Assessment/Plan:  45 y.o. [id number] History of monthly cramping but no bleeding after NovaSure endometrial ablation. Ultrasound shows some fluid in the lower uterine segment which is outlining a probable polyp. Will proceed with hysteroscopy D&C with resection of the endometrial polyp or other pathology as encountered. She understands that hysteroscopies following endometrial ablations can be difficult due to scarring and that I will not be overly aggressive for fear of perforation and there are no guarantees that will have an adequate hysteroscopy and that this will  alleviate her cramping.  I reviewed the proposed surgery with the patient to include the expected intraoperative and postoperative courses as well as the recovery period. The use of the hysteroscope, resectoscope and the D&C portion were all discussed. The risks of surgery to include infection, prolonged antibiotics, hemorrhage necessitating transfusion and the risks of transfusion, including transfusion reaction, hepatitis, HIV, mad cow disease and other unknown entities were all discussed understood and accepted. The risk of damage to internal organs during the procedure, either immediately recognized or delay recognized, including vagina, cervix, uterus, possible perforation causing damage to bowel, bladder, ureters, vessels and nerves necessitating major exploratory reparative surgery and future reparative surgeries including bladder repair, ureteral damage repair, bowel resection, ostomy formation was also discussed understood and accepted. The potential for distended media absorption leading to metabolic complications such as fluid overload, coma and seizures was also discussed understood and accepted. The patient's questions were answered to her satisfaction and she is ready to proceed with surgery.     Kathryn Auerbach MD, 11:25 AM 06/26/2015

## 2015-06-26 NOTE — Addendum Note (Signed)
Addended by: [name] on: 06/26/2015 11:48 AM   Modules accepted: Orders

## 2015-06-26 NOTE — Patient Instructions (Signed)
Office will call you with biopsy results.  Follow up for surgery as scheduled.

## 2015-06-29 NOTE — Patient Instructions (Signed)
Your procedure is scheduled on:  Tuesday, July 07, 2015  Enter through the Main Entrance of [facility] at: 9:00 AM   Pick up the phone at the desk and dial [phone].  Call this number if you have problems the morning of surgery: [phone].  Remember:  Do NOT eat food or drink after: Midnight Monday  Take these medicines the morning of surgery with a SIP OF WATER: Omeprazole, Asthma inhaler  Do NOT wear jewelry (body piercing), metal hair clips/bobby pins, make-up, or nail polish. Do NOT wear lotions, powders, or perfumes.  You may wear deodorant. Do NOT shave for 48 hours prior to surgery. Do NOT bring valuables to the hospital. Contacts, dentures, or bridgework may not be worn into surgery.  Have a responsible adult drive you home and stay with you for 24 hours after your procedure

## 2015-07-02 ENCOUNTER — Encounter (HOSPITAL_COMMUNITY)
Admission: RE | Admit: 2015-07-02 | Discharge: 2015-07-02 | Disposition: A | Discharge status: Home / Self Care | Payer: 59 | Source: Ambulatory Visit | Attending: Gynecology | Admitting: Gynecology

## 2015-07-02 ENCOUNTER — Encounter (HOSPITAL_COMMUNITY): Payer: Self-pay

## 2015-07-02 DIAGNOSIS — Z01812 Encounter for preprocedural laboratory examination: Secondary | ICD-10-CM | POA: Insufficient documentation

## 2015-07-02 HISTORY — DX: Patent foramen ovale: Q21.12

## 2015-07-02 HISTORY — DX: Atrial septal defect: Q21.1

## 2015-07-02 LAB — CBC
HCT: 42.8 % (ref 36.0–46.0)
Hemoglobin: 14.2 g/dL (ref 12.0–15.0)
MCH: 30.4 pg (ref 26.0–34.0)
MCHC: 33.2 g/dL (ref 30.0–36.0)
MCV: 91.6 fL (ref 78.0–100.0)
PLATELETS: 222 10*3/uL (ref 150–400)
RBC: 4.67 MIL/uL (ref 3.87–5.11)
RDW: 13 % (ref 11.5–15.5)
WBC: 4.9 10*3/uL (ref 4.0–10.5)

## 2015-07-06 ENCOUNTER — Telehealth: Payer: Self-pay

## 2015-07-06 ENCOUNTER — Other Ambulatory Visit: Payer: Self-pay | Admitting: Gynecology

## 2015-07-06 MED ORDER — MISOPROSTOL 200 MCG PO TABS: ORAL_TABLET | ORAL | Status: DC

## 2015-07-06 MED ORDER — DEXTROSE 5 % IV SOLN
2.0000 g | INTRAVENOUS | Status: AC
  Administered 2015-07-07: 2 g via INTRAVENOUS
  Filled 2015-07-06: qty 2

## 2015-07-06 NOTE — Telephone Encounter (Signed)
Patient called today because she went to pick up her Rx for Cytotec one tablet and it was not at [facility]. I had failed to send it in on the day we spoke. I sent it in and called her and apologized for the inconvenience.  I did speak with her and scheduled surgery for her a few weeks ago and at that time discussed her ins benefits and estimated financial prepaymt to [facility] and the need for Cytotec tab vaginally hs before surgery. I explained that she could see some light bleeding/spotting and may feel some cramping overnight and that just means the medication is working.  She will pick up Rx today and use it hs tonight as surgery is tomorrow morning.

## 2015-07-07 ENCOUNTER — Encounter (HOSPITAL_COMMUNITY): Payer: Self-pay | Admitting: Anesthesiology

## 2015-07-07 ENCOUNTER — Ambulatory Visit (HOSPITAL_COMMUNITY): Payer: 59 | Admitting: Anesthesiology

## 2015-07-07 ENCOUNTER — Encounter (HOSPITAL_COMMUNITY)
Admission: RE | Disposition: A | Discharge status: Home / Self Care | Payer: Self-pay | Source: Ambulatory Visit | Attending: Gynecology

## 2015-07-07 ENCOUNTER — Ambulatory Visit (HOSPITAL_COMMUNITY)
Admission: RE | Admit: 2015-07-07 | Discharge: 2015-07-07 | Disposition: A | Discharge status: Home / Self Care | Payer: 59 | Source: Ambulatory Visit | Attending: Gynecology | Admitting: Gynecology

## 2015-07-07 DIAGNOSIS — N840 Polyp of corpus uteri: Secondary | ICD-10-CM | POA: Diagnosis not present

## 2015-07-07 DIAGNOSIS — K219 Gastro-esophageal reflux disease without esophagitis: Secondary | ICD-10-CM | POA: Diagnosis not present

## 2015-07-07 DIAGNOSIS — N882 Stricture and stenosis of cervix uteri: Secondary | ICD-10-CM | POA: Insufficient documentation

## 2015-07-07 DIAGNOSIS — Z87891 Personal history of nicotine dependence: Secondary | ICD-10-CM | POA: Insufficient documentation

## 2015-07-07 DIAGNOSIS — R102 Pelvic and perineal pain: Secondary | ICD-10-CM | POA: Insufficient documentation

## 2015-07-07 HISTORY — PX: DILATATION & CURETTAGE/HYSTEROSCOPY WITH MYOSURE: SHX6511

## 2015-07-07 LAB — PREGNANCY, URINE: Preg Test, Ur: NEGATIVE

## 2015-07-07 SURGERY — DILATATION & CURETTAGE/HYSTEROSCOPY WITH MYOSURE
Anesthesia: General

## 2015-07-07 MED ORDER — LACTATED RINGERS IV SOLN
INTRAVENOUS | Status: DC
  Administered 2015-07-07 (×2): via INTRAVENOUS

## 2015-07-07 MED ORDER — PROPOFOL 10 MG/ML IV BOLUS
INTRAVENOUS | Status: DC | PRN
  Administered 2015-07-07: 200 mg via INTRAVENOUS

## 2015-07-07 MED ORDER — PROPOFOL 10 MG/ML IV BOLUS
INTRAVENOUS | Status: AC
  Filled 2015-07-07: qty 20

## 2015-07-07 MED ORDER — LIDOCAINE HCL 1 % IJ SOLN
INTRAMUSCULAR | Status: AC
  Filled 2015-07-07: qty 20

## 2015-07-07 MED ORDER — FENTANYL CITRATE (PF) 100 MCG/2ML IJ SOLN
INTRAMUSCULAR | Status: AC
Start: 2015-07-07 — End: 2015-07-07
  Filled 2015-07-07: qty 2

## 2015-07-07 MED ORDER — ONDANSETRON HCL 4 MG/2ML IJ SOLN: 4.0000 mg | Freq: Once | INTRAMUSCULAR | Status: DC | PRN

## 2015-07-07 MED ORDER — OXYCODONE-ACETAMINOPHEN 5-325 MG PO TABS: 1.0000 | ORAL_TABLET | ORAL | Status: DC | PRN

## 2015-07-07 MED ORDER — SCOPOLAMINE 1 MG/3DAYS TD PT72
1.0000 | MEDICATED_PATCH | Freq: Once | TRANSDERMAL | Status: DC
  Administered 2015-07-07: 1.5 mg via TRANSDERMAL

## 2015-07-07 MED ORDER — MIDAZOLAM HCL 2 MG/2ML IJ SOLN
INTRAMUSCULAR | Status: AC
  Filled 2015-07-07: qty 2

## 2015-07-07 MED ORDER — LIDOCAINE HCL (CARDIAC) 20 MG/ML IV SOLN
INTRAVENOUS | Status: AC
  Filled 2015-07-07: qty 5

## 2015-07-07 MED ORDER — KETOROLAC TROMETHAMINE 30 MG/ML IJ SOLN
INTRAMUSCULAR | Status: DC | PRN
  Administered 2015-07-07: 30 mg via INTRAVENOUS

## 2015-07-07 MED ORDER — FENTANYL CITRATE (PF) 250 MCG/5ML IJ SOLN
INTRAMUSCULAR | Status: DC | PRN
  Administered 2015-07-07: 100 ug via INTRAVENOUS

## 2015-07-07 MED ORDER — GLYCOPYRROLATE 0.2 MG/ML IJ SOLN
INTRAMUSCULAR | Status: DC | PRN
  Administered 2015-07-07: 0.1 mg via INTRAVENOUS

## 2015-07-07 MED ORDER — KETOROLAC TROMETHAMINE 30 MG/ML IJ SOLN: 30.0000 mg | Freq: Once | INTRAMUSCULAR | Status: DC

## 2015-07-07 MED ORDER — LIDOCAINE HCL 1 % IJ SOLN
INTRAMUSCULAR | Status: DC | PRN
  Administered 2015-07-07: 10 mL

## 2015-07-07 MED ORDER — IBUPROFEN 100 MG/5ML PO SUSP
200.0000 mg | Freq: Four times a day (QID) | ORAL | Status: DC | PRN
  Filled 2015-07-07: qty 20

## 2015-07-07 MED ORDER — IBUPROFEN 200 MG PO TABS
200.0000 mg | ORAL_TABLET | Freq: Four times a day (QID) | ORAL | Status: DC | PRN
  Filled 2015-07-07: qty 2

## 2015-07-07 MED ORDER — SODIUM CHLORIDE 0.9 % IR SOLN
Status: DC | PRN
  Administered 2015-07-07: 3000 mL

## 2015-07-07 MED ORDER — DEXAMETHASONE SODIUM PHOSPHATE 4 MG/ML IJ SOLN
INTRAMUSCULAR | Status: DC | PRN
  Administered 2015-07-07: 10 mg via INTRAVENOUS

## 2015-07-07 MED ORDER — SCOPOLAMINE 1 MG/3DAYS TD PT72
MEDICATED_PATCH | TRANSDERMAL | Status: AC
  Administered 2015-07-07: 1.5 mg via TRANSDERMAL
  Filled 2015-07-07: qty 1

## 2015-07-07 MED ORDER — MEPERIDINE HCL 25 MG/ML IJ SOLN: 6.2500 mg | INTRAMUSCULAR | Status: DC | PRN

## 2015-07-07 MED ORDER — HYDROCODONE-ACETAMINOPHEN 7.5-325 MG PO TABS: 1.0000 | ORAL_TABLET | Freq: Once | ORAL | Status: DC | PRN

## 2015-07-07 MED ORDER — ONDANSETRON HCL 4 MG/2ML IJ SOLN
INTRAMUSCULAR | Status: DC | PRN
  Administered 2015-07-07: 4 mg via INTRAVENOUS

## 2015-07-07 MED ORDER — LIDOCAINE HCL (CARDIAC) 20 MG/ML IV SOLN
INTRAVENOUS | Status: DC | PRN
  Administered 2015-07-07: 100 mg via INTRAVENOUS

## 2015-07-07 MED ORDER — ONDANSETRON HCL 4 MG/2ML IJ SOLN
INTRAMUSCULAR | Status: AC
  Filled 2015-07-07: qty 2

## 2015-07-07 MED ORDER — FENTANYL CITRATE (PF) 100 MCG/2ML IJ SOLN: 25.0000 ug | INTRAMUSCULAR | Status: DC | PRN

## 2015-07-07 MED ORDER — MIDAZOLAM HCL 2 MG/2ML IJ SOLN
INTRAMUSCULAR | Status: DC | PRN
  Administered 2015-07-07: 2 mg via INTRAVENOUS

## 2015-07-07 MED ORDER — DEXAMETHASONE SODIUM PHOSPHATE 10 MG/ML IJ SOLN
INTRAMUSCULAR | Status: AC
  Filled 2015-07-07: qty 1

## 2015-07-07 SURGICAL SUPPLY — 17 items
CATH ROBINSON RED A/P 16FR (CATHETERS) ×3 IMPLANT
CLOTH BEACON ORANGE TIMEOUT ST (SAFETY) ×3 IMPLANT
CONTAINER PREFILL 10% NBF 60ML (FORM) ×6 IMPLANT
DEVICE MYOSURE LITE (MISCELLANEOUS) IMPLANT
DEVICE MYOSURE REACH (MISCELLANEOUS) IMPLANT
FILTER ARTHROSCOPY CONVERTOR (FILTER) ×3 IMPLANT
GLOVE BIO SURGEON STRL SZ7.5 (GLOVE) ×6 IMPLANT
GLOVE BIOGEL PI IND STRL 7.0 (GLOVE) ×1 IMPLANT
GLOVE BIOGEL PI INDICATOR 7.0 (GLOVE) ×2
GOWN STRL REUS W/TWL LRG LVL3 (GOWN DISPOSABLE) ×6 IMPLANT
PACK VAGINAL MINOR WOMEN LF (CUSTOM PROCEDURE TRAY) ×3 IMPLANT
PAD OB MATERNITY 4.3X12.25 (PERSONAL CARE ITEMS) ×3 IMPLANT
SEAL ROD LENS SCOPE MYOSURE (ABLATOR) ×3 IMPLANT
TOWEL OR 17X24 6PK STRL BLUE (TOWEL DISPOSABLE) ×6 IMPLANT
TUBING AQUILEX INFLOW (TUBING) ×3 IMPLANT
TUBING AQUILEX OUTFLOW (TUBING) ×3 IMPLANT
WATER STERILE IRR 1000ML POUR (IV SOLUTION) ×3 IMPLANT

## 2015-07-07 NOTE — Discharge Instructions (Signed)
° °  Postoperative Instructions Hysteroscopy D & C ° °Dr. Fontaine and the nursing staff have discussed postoperative instructions with you.  If you have any questions please ask them before you leave the hospital, or call Dr Fontaine’s office at [phone].   ° °We would like to emphasize the following instructions: ° ° °? Call the office to make your follow-up appointment as recommended by Dr Fontaine (usually 1-2 weeks). ° °? You were given a prescription, or one was ordered for you at the pharmacy you designated.  Get that prescription filled and take the medication according to instructions. ° °? You may eat a regular diet, but slowly until you start having bowel movements. ° °? Drink plenty of water daily. ° °? Nothing in the vagina (intercourse, douching, objects of any kind) for two weeks.  When reinitiating intercourse, if it is uncomfortable, stop and make an appointment with Dr Fontaine to be evaluated. ° °? No driving for one to two days until the effects of anesthesia has worn off.  No traveling out of town for several days. ° °? You may shower, but no baths for one week.  Walking up and down stairs is ok.  No heavy lifting, prolonged standing, repeated bending or any “working out” until your post op check. ° °? Rest frequently, listen to your body and do not push yourself and overdo it. ° °? Call if: ° °o Your pain medication does not seem strong enough. °o Worsening pain or abdominal bloating °o Persistent nausea or vomiting °o Difficulty with urination or bowel movements. °o Temperature of 101 degrees or higher. °o Heavy vaginal bleeding.  If your period is due, you may use tampons. °o You have any questions or concerns ° ° ° °Post Anesthesia Home Care Instructions ° °Activity: °Get plenty of rest for the remainder of the day. A responsible adult should stay with you for 24 hours following the procedure.  °For the next 24 hours, DO NOT: °-Drive a car °-Operate machinery °-Drink alcoholic  beverages °-Take any medication unless instructed by your physician °-Make any legal decisions or sign important papers. ° °Meals: °Start with liquid foods such as gelatin or soup. Progress to regular foods as tolerated. Avoid greasy, spicy, heavy foods. If nausea and/or vomiting occur, drink only clear liquids until the nausea and/or vomiting subsides. Call your physician if vomiting continues. ° °Special Instructions/Symptoms: °Your throat may feel dry or sore from the anesthesia or the breathing tube placed in your throat during surgery. If this causes discomfort, gargle with warm salt water. The discomfort should disappear within 24 hours. ° °If you had a scopolamine patch placed behind your ear for the management of post- operative nausea and/or vomiting: ° °1. The medication in the patch is effective for 72 hours, after which it should be removed.  Wrap patch in a tissue and discard in the trash. Wash hands thoroughly with soap and water. °2. You may remove the patch earlier than 72 hours if you experience unpleasant side effects which may include dry mouth, dizziness or visual disturbances. °3. Avoid touching the patch. Wash your hands with soap and water after contact with the patch. °  ° °

## 2015-07-07 NOTE — Anesthesia Postprocedure Evaluation (Signed)
Anesthesia Post Note  Patient: Kathryn Larsen  Procedure(s) Performed: Procedure(s) (LRB): DILATATION & CURETTAGE  attempted HYSTEROSCOPY  (N/A)  Patient location during evaluation: PACU Anesthesia Type: General Level of consciousness: awake Pain management: pain level controlled Vital Signs Assessment: post-procedure vital signs reviewed and stable Respiratory status: spontaneous breathing Cardiovascular status: stable Postop Assessment: no signs of nausea or vomiting Anesthetic complications: no    Last Vitals:  Filed Vitals:   07/07/15 1145 07/07/15 1200  BP: 110/75 116/77  Pulse: 57 58  Temp:  36.6 C  Resp: 14 16    Last Pain:  Filed Vitals:   07/07/15 1212  PainSc: [place]

## 2015-07-07 NOTE — Anesthesia Preprocedure Evaluation (Signed)
Anesthesia Evaluation  Patient identified by MRN, date of birth, ID band Patient awake    Reviewed: Allergy & Precautions, H&P , Patient's Chart, lab work & pertinent test results  Airway Mallampati: I  TM Distance: >3 FB Neck ROM: full    Dental no notable dental hx. (+) Teeth Intact   Pulmonary former smoker,    Pulmonary exam normal        Cardiovascular negative cardio ROS Normal cardiovascular exam     Neuro/Psych negative psych ROS   GI/Hepatic Neg liver ROS, GERD  Medicated and Controlled,  Endo/Other  negative endocrine ROS  Renal/GU negative Renal ROS     Musculoskeletal   Abdominal Normal abdominal exam  (+)   Peds  Hematology negative hematology ROS (+)   Anesthesia Other Findings   Reproductive/Obstetrics negative OB ROS                             Anesthesia Physical Anesthesia Plan  ASA: II  Anesthesia Plan: General   Post-op Pain Management:    Induction: Intravenous  Airway Management Planned: LMA  Additional Equipment:   Intra-op Plan:   Post-operative Plan:   Informed Consent: I have reviewed the patients History and Physical, chart, labs and discussed the procedure including the risks, benefits and alternatives for the proposed anesthesia with the patient or authorized representative who has indicated his/her understanding and acceptance.     Plan Discussed with: CRNA and Surgeon  Anesthesia Plan Comments:         Anesthesia Quick Evaluation

## 2015-07-07 NOTE — Anesthesia Procedure Notes (Signed)
Procedure Name: LMA Insertion Date/Time: 07/07/2015 10:26 AM Performed by: Flossie Dibble Pre-anesthesia Checklist: Patient identified, Emergency Drugs available, Timeout performed, Patient being monitored and Suction available Patient Re-evaluated:Patient Re-evaluated prior to inductionOxygen Delivery Method: Circle system utilized Preoxygenation: Pre-oxygenation with 100% oxygen Intubation Type: IV induction LMA: LMA inserted LMA Size: 4.0 Number of attempts: 1 Placement Confirmation: breath sounds checked- equal and bilateral and positive ETCO2 Tube secured with: Tape Dental Injury: Teeth and Oropharynx as per pre-operative assessment

## 2015-07-07 NOTE — Op Note (Signed)
Kathryn Larsen 06/18/1970 [id number]   Post Operative Note   Date of surgery:  07/07/2015  Pre Op Dx:  Pelvic pain, endometrial polyp  Post Op Dx:  Pelvic pain, endometrial polyp, cervical stenosis/scarring  Procedure:  Hysteroscopy, dilatation and endocervical curettage  Surgeon:  FONTAINE,TIMOTHY P  Anesthesia:  General  EBL:  minimal  Distended media discrepancy:  30 cc saline  Complications:  None  Specimen:  Endocervical curetting to pathology  Findings: EUA:  External BUS vagina normal. Cervix grossly normal. Uterus normal size midline mobile. Adnexa without gross masses   Hysteroscopy: In adequate with visualization to the lower cervical canal. Unable to clearly identify upper cervical canal with limited dilatation.  Procedure:  The patient was taken to the operating room, was placed in the low dorsal lithotomy position, underwent general anesthesia, received a perineal/vaginal preparation with Betadine solution per nursing personnel and the bladder was emptied with in and out Foley catheterization. The timeout was performed by the surgical team. An EUA was performed. The patient was draped in the usual fashion. The cervix was visualized with a speculum, anterior lip grasped with a single-tooth tenaculum and a paracervical block was placed using 10 cc's of 1% lidocaine. The cervix was attempted to be dilated with resistance met at 2-3 cm.  The Myosure hysteroscope was then introduced and a small opening was noted at the lower end of the cervical canal. Using small dilators the area was slightly dilated but was found to be a blind ending pouch. At this point it was decided to abandon continued attempts at dilatation for fear of perforation and damage to surrounding structures. A sharp endocervical curettage was performed. The instruments were removed and adequate hemostasis was visualized at the tenaculum site and external cervical os. The patient was given intraoperative Toradol,  was awakened without difficulty and was taken to the recovery room in good condition having tolerated the procedure well.     FONTAINE,TIMOTHY P MD, 11:19 AM 07/07/2015     

## 2015-07-07 NOTE — H&P (Signed)
  The patient was examined.  I reviewed the proposed surgery and consent form with the patient.  The dictated history and physical is current and accurate and all questions were answered. The patient is ready to proceed with surgery and has a realistic understanding and expectation for the outcome.   [name] MD, 10:02 AM 07/07/2015

## 2015-07-07 NOTE — Transfer of Care (Signed)
Immediate Anesthesia Transfer of Care Note  Patient: Kathryn Larsen  Procedure(s) Performed: Procedure(s): DILATATION & CURETTAGE  attempted HYSTEROSCOPY  (N/A)  Patient Location: PACU  Anesthesia Type:General  Level of Consciousness: awake, alert  and oriented  Airway & Oxygen Therapy: Patient Spontanous Breathing and Patient connected to nasal cannula oxygen  Post-op Assessment: Report given to RN and Post -op Vital signs reviewed and stable  Post vital signs: Reviewed and stable  Last Vitals:  Filed Vitals:   07/07/15 0908  BP: 132/87  Pulse: 71  Temp: 36.6 C  Resp: 20    Complications: No apparent anesthesia complications

## 2015-07-08 ENCOUNTER — Encounter (HOSPITAL_COMMUNITY): Payer: Self-pay | Admitting: Gynecology

## 2015-07-23 ENCOUNTER — Ambulatory Visit (INDEPENDENT_AMBULATORY_CARE_PROVIDER_SITE_OTHER): Payer: 59 | Admitting: Gynecology

## 2015-07-23 ENCOUNTER — Telehealth: Payer: Self-pay

## 2015-07-23 ENCOUNTER — Encounter: Payer: Self-pay | Admitting: Gynecology

## 2015-07-23 VITALS — BP 116/74

## 2015-07-23 DIAGNOSIS — IMO0002 Reserved for concepts with insufficient information to code with codable children: Secondary | ICD-10-CM

## 2015-07-23 DIAGNOSIS — N840 Polyp of corpus uteri: Secondary | ICD-10-CM

## 2015-07-23 DIAGNOSIS — Z9889 Other specified postprocedural states: Secondary | ICD-10-CM

## 2015-07-23 DIAGNOSIS — N858 Other specified noninflammatory disorders of uterus: Secondary | ICD-10-CM

## 2015-07-23 DIAGNOSIS — R896 Abnormal cytological findings in specimens from other organs, systems and tissues: Secondary | ICD-10-CM

## 2015-07-23 DIAGNOSIS — R102 Pelvic and perineal pain: Secondary | ICD-10-CM

## 2015-07-23 NOTE — Patient Instructions (Signed)
Office will call you to arrange surgery. 

## 2015-07-23 NOTE — Telephone Encounter (Signed)
I called patient regarding scheduling surgery. We discussed her ins benefits and her estimated surgery prepayment due to [facility] prior to surgery.  Patient wants me to call her with June schedule and she is going to talk with her employer to see when it will work out for her to schedule this. I will call her as soon as June schedule is available to me.

## 2015-07-23 NOTE — Progress Notes (Signed)
    LATANZA MECKLEY 1970-08-02 [id number]        45 y.o.  [id number] Presents for postoperative visit status post attempted/aborted hysteroscopy. History of pelvic cramping on and off throughout the month starting proximally 6 months ago. Past history of NovaSure endometrial ablation. Ultrasound showed 20 x 5 mm lower uterine segment probable polyp with positive flow. Also a small amount of hematometria. Attempted hysteroscopy D&C but was unable to negotiate past the external os due to scarring. ECC showed benign endocervical tissue. Does have recent workup and evaluation for LGSIL and positive high-risk HPV. Has done well since the surgery.  Past medical history,surgical history, problem list, medications, allergies, family history and social history were all reviewed and documented in the EPIC chart.  Directed ROS with pertinent positives and negatives documented in the history of present illness/assessment and plan.  Exam: [name] assistant Filed Vitals:   07/23/15 1136  BP: 116/74   General appearance:  Normal Abdomen soft nontender without masses guarding rebound Pelvic external BUS vagina normal. Cervix normal. Uterus normal size midline mobile nontender. Adnexa without masses or tenderness.  Assessment/Plan:  45 y.o. [id number] with pelvic cramping status post ablation with ultrasound suggesting small hematometria with 20 x 5 mm probable polyp with positive flow. Unable to sample with hysteroscopy D&C due to scarring from the ablation. Also with recent LGSIL positive high-risk HPV. Options for management reviewed to include observation with serial ultrasounds to monitor this area versus proceeding with hysterectomy. Given her intermittent pain which I think is probably due to the small hematometria, positive flow polypoid area lower uterine segment our recommendation would be to proceed with hysterectomy. Recommend LAVH and bilateral salpingectomies. Does have a prior history of cesarean  section and would allow for bladder flap mobilization as well as assessment for alternative causes of her pelvic pain to include endometriosis. Risk reductive surgery as recommended by [facility] with bilateral salpingectomies also discussed. Patient wants to proceed with the surgery and will go ahead and schedule and arrange at her convenience. I did review with her we will be removing her cervix which would eliminate the issues of cervical dysplasia but given her positive HPV she certainly is still at risk for vaginal dysplasia in the future. Also no guarantees we will be able to do this laparoscopically and may convert to a TAH if unexpected findings at the time of surgery such as scarring from her cesarean section our anatomic variants which would mean a larger incision with a longer recovery.    [name] MD, 11:57 AM 07/23/2015

## 2015-07-28 ENCOUNTER — Telehealth: Payer: Self-pay

## 2015-07-28 NOTE — Telephone Encounter (Signed)
At patient's earlier request I contacted her to let her know I now have June schedule available.  She wants to have surgeyr 09/15/15 and I scheduled her at 9:45am that day. We had previously discussed ins benefits and her estimated financial responsibility to [facility] and I will be mailing her a Ambulance person.  She scheduled her pre op consult as well.

## 2015-07-30 ENCOUNTER — Other Ambulatory Visit: Payer: Self-pay

## 2015-07-30 ENCOUNTER — Ambulatory Visit (INDEPENDENT_AMBULATORY_CARE_PROVIDER_SITE_OTHER): Payer: 59 | Admitting: Physician Assistant

## 2015-07-30 ENCOUNTER — Encounter: Payer: Self-pay | Admitting: Physician Assistant

## 2015-07-30 VITALS — BP 112/70 | HR 96 | Temp 97.5°F | Resp 16 | Ht 66.0 in | Wt 179.8 lb

## 2015-07-30 DIAGNOSIS — J029 Acute pharyngitis, unspecified: Secondary | ICD-10-CM | POA: Diagnosis not present

## 2015-07-30 MED ORDER — PREDNISONE 20 MG PO TABS: ORAL_TABLET | ORAL | Status: DC

## 2015-07-30 MED ORDER — AZITHROMYCIN 250 MG PO TABS: ORAL_TABLET | ORAL | Status: AC

## 2015-07-30 NOTE — Progress Notes (Signed)
   Subjective:    Patient ID: Kathryn Larsen, female    DOB: Feb 06, 1971, 45 y.o.   MRN: [id number]  HPI 45 y.o. WF presents with sore throat x Monday, getting progressively worse, no sick contacts, body aches, no fever/chills. Some ear pain, no sinus congestion, cough.   Blood pressure 112/70, pulse 96, temperature 97.5 F (36.4 C), resp. rate 16, height 5\' 6"  (1.676 m), weight 179 lb 12.8 oz (81.557 kg), SpO2 98 %.  Past Medical History  Diagnosis Date  . GERD (gastroesophageal reflux disease)   . Hyperlipemia   . Chronic headaches   . Eczema   . Restless leg   . LGSIL (low grade squamous intraepithelial dysplasia) 07/2013, 05/2015    Follow up colposcopy adequate/normal  ECC negative 2015  . 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 PFO (patent foramen ovale)     Patient states she has no symptoms, this was diagnosis on echocardiogram   Current Outpatient Prescriptions on File Prior to Visit  Medication Sig Dispense Refill  . Cetirizine HCl (ZYRTEC PO) Take 10 mg by mouth daily.     . fexofenadine (ALLEGRA) 180 MG tablet Take 180 mg by mouth daily.    Marland Kitchen omeprazole (PRILOSEC) 40 MG capsule Take 1 capsule (40 mg total) by mouth daily. 30 capsule 1   No current facility-administered medications on file prior to visit.   Review of Systems  Constitutional: Negative for chills and diaphoresis.  HENT: Positive for ear pain, sore throat and voice change. Negative for congestion, sinus pressure, sneezing and trouble swallowing.   Respiratory: Negative.  Negative for cough and shortness of breath.   Cardiovascular: Negative.   Musculoskeletal: Negative for neck pain.  Neurological: Negative.  Negative for headaches.       Objective:   Physical Exam  Constitutional: She appears well-developed and well-nourished.  HENT:  Head: Normocephalic and atraumatic.  Right Ear: External ear normal.  Left Ear: External ear normal.  Mouth/Throat: Uvula is midline and mucous membranes are normal.  Posterior oropharyngeal edema and posterior oropharyngeal erythema present.  Eyes: Conjunctivae and EOM are normal. Pupils are equal, round, and reactive to light.  Neck: Normal range of motion. Neck supple.  Cardiovascular: Normal rate, regular rhythm and normal heart sounds.   Pulmonary/Chest: Effort normal and breath sounds normal.  Abdominal: Soft. Bowel sounds are normal.  Lymphadenopathy:    She has no cervical adenopathy.  Skin: Skin is warm and dry.      Assessment & Plan:  1. Pharyngitis Will hold the zpak and take if she is not getting better, increase fluids, rest, cont allergy pill, prednisone, salt water gargles.

## 2015-07-30 NOTE — Patient Instructions (Signed)
Please take the prednisone to help decrease inflammation and therefore decrease symptoms. Take it it with food to avoid GI upset. It can cause increased energy but on the other hand it can make it hard to sleep at night so please take it AT NIGHT WITH DINNER, it takes 8-12 hours to start working so it will NOT affect your sleeping if you take it at night with your food!!  If you are diabetic it will increase your sugars so decrease carbs and monitor your sugars closely.    HOW TO TREAT VIRAL COUGH AND COLD SYMPTOMS:  -Symptoms usually last at least 1 week with the worst symptoms being around day 4.  - colds usually start with a sore throat and end with a cough, and the cough can take 2 weeks to get better.  -No antibiotics are needed for colds, flu, sore throats, cough, bronchitis UNLESS symptoms are longer than 7 days OR if you are getting better then get drastically worse.  -There are a lot of combination medications (Dayquil, Nyquil, Vicks 44, tyelnol cold and sinus, ETC). Please look at the ingredients on the back so that you are treating the correct symptoms and not doubling up on medications/ingredients.    Medicines you can use  Nasal congestion  - pseudoephedrine (Sudafed)- behind the counter, do not use if you have high blood pressure, medicine that have -D in them.  - phenylephrine (Sudafed PE) -Dextormethorphan + chlorpheniramine (Coridcidin HBP)- okay if you have high blood pressure -Oxymetazoline (Afrin) nasal spray- LIMIT to 3 days -Saline nasal spray -Neti pot (used distilled or bottled water)  Ear pain/congestion  -pseudoephedrine (sudafed) - Nasonex/flonase nasal spray  Fever  -Acetaminophen (Tyelnol) -Ibuprofen (Advil, motrin, aleve)  Sore Throat  -Acetaminophen (Tyelnol) -Ibuprofen (Advil, motrin, aleve) -Drink a lot of water -Gargle with salt water - Rest your voice (don't talk) -Throat sprays -Cough drops  Body Aches  -Acetaminophen (Tyelnol) -Ibuprofen  (Advil, motrin, aleve)  Headache  -Acetaminophen (Tyelnol) -Ibuprofen (Advil, motrin, aleve) - Exedrin, Exedrin Migraine  Allergy symptoms (cough, sneeze, runny nose, itchy eyes) -Claritin or loratadine cheapest but likely the weakest  -Zyrtec or certizine at night because it can make you sleepy -The strongest is allegra or fexafinadine  Cheapest at [employer], [employer], [employer]  Cough  -Dextromethorphan (Delsym)- medicine that has DM in it -Guafenesin (Mucinex/Robitussin) - cough drops - drink lots of water  Chest Congestion  -Guafenesin (Mucinex/Robitussin)  Red Itchy Eyes  - Naphcon-A  Upset Stomach  - Bland diet (nothing spicy, greasy, fried, and high acid foods like tomatoes, oranges, berries) -OKAY- cereal, bread, soup, crackers, rice -Eat smaller more frequent meals -reduce caffeine, no alcohol -Loperamide (Imodium-AD) if diarrhea -Prevacid for heart burn  General health when sick  -Hydration -wash your hands frequently -keep surfaces clean -change pillow cases and sheets often -Get fresh air but do not exercise strenuously -Vitamin D, double up on it - Vitamin C -Zinc       

## 2015-08-21 DIAGNOSIS — Z0289 Encounter for other administrative examinations: Secondary | ICD-10-CM

## 2015-08-24 ENCOUNTER — Encounter: Payer: Self-pay | Admitting: Gynecology

## 2015-08-28 ENCOUNTER — Encounter: Payer: Self-pay | Admitting: Gynecology

## 2015-08-28 ENCOUNTER — Ambulatory Visit (INDEPENDENT_AMBULATORY_CARE_PROVIDER_SITE_OTHER): Payer: 59 | Admitting: Gynecology

## 2015-08-28 VITALS — BP 120/76

## 2015-08-28 DIAGNOSIS — R102 Pelvic and perineal pain: Secondary | ICD-10-CM

## 2015-08-28 DIAGNOSIS — N840 Polyp of corpus uteri: Secondary | ICD-10-CM | POA: Diagnosis not present

## 2015-08-28 DIAGNOSIS — IMO0002 Reserved for concepts with insufficient information to code with codable children: Secondary | ICD-10-CM

## 2015-08-28 DIAGNOSIS — R87810 Cervical high risk human papillomavirus (HPV) DNA test positive: Secondary | ICD-10-CM | POA: Diagnosis not present

## 2015-08-28 DIAGNOSIS — N881 Old laceration of cervix uteri: Secondary | ICD-10-CM

## 2015-08-28 DIAGNOSIS — R896 Abnormal cytological findings in specimens from other organs, systems and tissues: Secondary | ICD-10-CM

## 2015-08-28 NOTE — H&P (Signed)
Kathryn Larsen 01/15/1971 [id number]   History and Physical  Chief complaint: pelvic pain, hematometria, endometrial polyp, cervical scarring, LGSIL, positive high-risk HPV  History of present illness: 45 y.o. [id number] with a history of pelvic cramping over the past year. Status post NovaSure endometrial ablation in the past. Ultrasound shows a small hematometria with a 20 x 5 mm lower uterine segment polyp with positive vascular flow. Attempted hysteroscopy D&C unsuccessful due to significant cervical scarring. Recent Pap smear shows LGSIL with positive high-risk HPV. Recent ECC was negative. Options for management reviewed with the patient and ultimately she elects for LAVH, bilateral salpingectomies.  Past medical history,surgical history, medications, allergies, family history and social history were all reviewed and documented in the EPIC chart.  ROS:  Was performed and pertinent positives and negatives are included in the history of present illness.  Exam:  [name] assistant 08/28/2015 Filed Vitals:   08/28/15 1142  BP: 120/76   General: well developed, well nourished female, no acute distress HEENT: normal  Lungs: clear to auscultation without wheezing, rales or rhonchi  Cardiac: regular rate without rubs, murmurs or gallops  Abdomen: soft, nontender without masses, guarding, rebound, organomegaly  Pelvic: external bus vagina: normal   Cervix: grossly normal  Uterus: normal size, midline and mobile, nontender  Adnexa: without masses or tenderness     Assessment/Plan:  45 y.o. [id number] with the above history for LAVH bilateral salpingectomies. Patient understands that at any time during the procedure I may convert to a TAH with a larger incision and a longer recovery period. The patient desire to keep both ovaries and understands by doing so she retains the risk of ovarian disease to include ovarian cancer in the future.  She also understands that we will look at her ovaries  during the surgery and if it is my best professional recommendation to remove one or both ovaries at that time, she gives me permission to remove one or both ovaries and that we would discuss hormone replacement therapy afterwards.  I did discuss the SGO recommendations for prophylactic risk reductive salpingectomies to help reduce the risk of "ovarian cancer" in the future and she agrees to this. The absolute and irreversible sterility associated with hysterectomy was also discussed and accepted. Sexuality following hysterectomy was reviewed and the potential for persistent dyspareunia or orgasmic dysfunction following the procedure was discussed. She understands as far as pain relief and that she may continue to have pelvic cramping and discomfort following the procedure. I also reviewed her history of persistent LGSIL with positive high-risk HPV and she understands that we are not eradicating the virus and that she still is at risk for vaginal dysplasia in the future and we will continue to monitor her for this. The expected intraoperative and postoperative courses as well as the recovery period were reviewed. The risks of infection, prolonged antibiotics, reoperation for abscess or hematoma formation was discussed. The risks of hemorrhage necessitating transfusion and the risks of transfusion reaction, hepatitis, HIV, mad cow disease and other unknown entities was also discussed. Incisional complications to include opening and draining of incisions and closure by secondary intention, dehiscence and long-term issues of keloid/cosmetics and hernia formation were reviewed. The risk of inadvertent injury to internal organs including bowel, bladder, ureters, vessels, nerves either immediately recognized or delay recognized necessitating major exploratory reparative surgeries and future reparative surgeries including bowel resection, ostomy formation, bladder repair, ureteral damage repair was discussed with her. The  patient's questions were answered to her satisfaction and  she is ready to proceed with surgery.    [name] MD, 12:21 PM 08/28/2015

## 2015-08-28 NOTE — Progress Notes (Signed)
LINDAMARIE SNEDDON Sep 28, 1970 [id number]   History and Physical  Chief complaint: pelvic pain, hematometria, endometrial polyp, cervical scarring, LGSIL, positive high-risk HPV  History of present illness: 45 y.o. [id number] with a history of pelvic cramping over the past year. Status post NovaSure endometrial ablation in the past. Ultrasound shows a small hematometria with a 20 x 5 mm lower uterine segment polyp with positive vascular flow. Attempted hysteroscopy D&C unsuccessful due to significant cervical scarring. Recent Pap smear shows LGSIL with positive high-risk HPV. Recent ECC was negative. Options for management reviewed with the patient and ultimately she elects for LAVH, bilateral salpingectomies.  Past medical history,surgical history, medications, allergies, family history and social history were all reviewed and documented in the EPIC chart.  ROS:  Was performed and pertinent positives and negatives are included in the history of present illness.  Exam:  [name] assistant Filed Vitals:   08/28/15 1142  BP: 120/76   General: well developed, well nourished female, no acute distress HEENT: normal  Lungs: clear to auscultation without wheezing, rales or rhonchi  Cardiac: regular rate without rubs, murmurs or gallops  Abdomen: soft, nontender without masses, guarding, rebound, organomegaly  Pelvic: external bus vagina: normal   Cervix: grossly normal  Uterus: normal size, midline and mobile, nontender  Adnexa: without masses or tenderness     Assessment/Plan:  45 y.o. [id number] with the above history for LAVH bilateral salpingectomies. Patient understands that at any time during the procedure I may convert to a TAH with a larger incision and a longer recovery period. The patient desire to keep both ovaries and understands by doing so she retains the risk of ovarian disease to include ovarian cancer in the future.  She also understands that we will look at her ovaries during the  surgery and if it is my best professional recommendation to remove one or both ovaries at that time, she gives me permission to remove one or both ovaries and that we would discuss hormone replacement therapy afterwards.  I did discuss the SGO recommendations for prophylactic risk reductive salpingectomies to help reduce the risk of "ovarian cancer" in the future and she agrees to this. The absolute and irreversible sterility associated with hysterectomy was also discussed and accepted. Sexuality following hysterectomy was reviewed and the potential for persistent dyspareunia or orgasmic dysfunction following the procedure was discussed. She understands as far as pain relief and that she may continue to have pelvic cramping and discomfort following the procedure. I also reviewed her history of persistent LGSIL with positive high-risk HPV and she understands that we are not eradicating the virus and that she still is at risk for vaginal dysplasia in the future and we will continue to monitor her for this. The expected intraoperative and postoperative courses as well as the recovery period were reviewed. The risks of infection, prolonged antibiotics, reoperation for abscess or hematoma formation was discussed. The risks of hemorrhage necessitating transfusion and the risks of transfusion reaction, hepatitis, HIV, mad cow disease and other unknown entities was also discussed. Incisional complications to include opening and draining of incisions and closure by secondary intention, dehiscence and long-term issues of keloid/cosmetics and hernia formation were reviewed. The risk of inadvertent injury to internal organs including bowel, bladder, ureters, vessels, nerves either immediately recognized or delay recognized necessitating major exploratory reparative surgeries and future reparative surgeries including bowel resection, ostomy formation, bladder repair, ureteral damage repair was discussed with her. The patient's  questions were answered to her satisfaction and she  is ready to proceed with surgery.    [name] MD, 12:06 PM 08/28/2015

## 2015-08-28 NOTE — Patient Instructions (Signed)
Followup for surgery as scheduled. 

## 2015-09-08 NOTE — Patient Instructions (Signed)
Your procedure is scheduled on:  Tuesday, September 15, 2015  Enter through the [employer] [facility] at:  8:30 AM  Pick up the phone at the desk and dial [phone].  Call this number if you have problems the morning of surgery: [phone].  Remember: Do NOT eat food or drink after:  Midnight Monday  Take these medicines the morning of surgery with a SIP OF WATER:  Omeprazole  Do NOT wear jewelry (body piercing), metal hair clips/bobby pins, make-up, or nail polish. Do NOT wear lotions, powders, or perfumes.  You may wear deodorant. Do NOT shave for 48 hours prior to surgery. Do NOT bring valuables to the hospital. Contacts, dentures, or bridgework may not be worn into surgery.  Leave suitcase in car.  After surgery it may be brought to your room.  For patients admitted to the hospital, checkout time is 11:00 AM the day of discharge.

## 2015-09-09 ENCOUNTER — Encounter (HOSPITAL_COMMUNITY)
Admission: RE | Admit: 2015-09-09 | Discharge: 2015-09-09 | Disposition: A | Discharge status: Home / Self Care | Payer: 59 | Source: Ambulatory Visit | Attending: Gynecology | Admitting: Gynecology

## 2015-09-09 ENCOUNTER — Encounter (HOSPITAL_COMMUNITY): Payer: Self-pay

## 2015-09-09 DIAGNOSIS — R87612 Low grade squamous intraepithelial lesion on cytologic smear of cervix (LGSIL): Secondary | ICD-10-CM | POA: Diagnosis not present

## 2015-09-09 DIAGNOSIS — R102 Pelvic and perineal pain: Secondary | ICD-10-CM | POA: Diagnosis not present

## 2015-09-09 DIAGNOSIS — Z01812 Encounter for preprocedural laboratory examination: Secondary | ICD-10-CM | POA: Insufficient documentation

## 2015-09-09 DIAGNOSIS — N857 Hematometra: Secondary | ICD-10-CM | POA: Insufficient documentation

## 2015-09-09 LAB — CBC
HEMATOCRIT: 38.8 % (ref 36.0–46.0)
Hemoglobin: 13.2 g/dL (ref 12.0–15.0)
MCH: 31.1 pg (ref 26.0–34.0)
MCHC: 34 g/dL (ref 30.0–36.0)
MCV: 91.3 fL (ref 78.0–100.0)
Platelets: 229 10*3/uL (ref 150–400)
RBC: 4.25 MIL/uL (ref 3.87–5.11)
RDW: 12.9 % (ref 11.5–15.5)
WBC: 6.5 10*3/uL (ref 4.0–10.5)

## 2015-09-14 MED ORDER — DEXTROSE 5 % IV SOLN
2.0000 g | INTRAVENOUS | Status: AC
  Administered 2015-09-15: 2 g via INTRAVENOUS
  Filled 2015-09-14: qty 2

## 2015-09-15 ENCOUNTER — Observation Stay (HOSPITAL_COMMUNITY)
Admission: RE | Admit: 2015-09-15 | Discharge: 2015-09-16 | Disposition: A | Discharge status: Home / Self Care | Payer: 59 | Source: Ambulatory Visit | Attending: Gynecology | Admitting: Gynecology

## 2015-09-15 ENCOUNTER — Ambulatory Visit (HOSPITAL_COMMUNITY): Payer: 59 | Admitting: Anesthesiology

## 2015-09-15 ENCOUNTER — Encounter (HOSPITAL_COMMUNITY): Payer: Self-pay

## 2015-09-15 ENCOUNTER — Encounter (HOSPITAL_COMMUNITY)
Admission: RE | Disposition: A | Discharge status: Home / Self Care | Payer: Self-pay | Source: Ambulatory Visit | Attending: Gynecology

## 2015-09-15 DIAGNOSIS — N857 Hematometra: Secondary | ICD-10-CM | POA: Diagnosis not present

## 2015-09-15 DIAGNOSIS — N881 Old laceration of cervix uteri: Secondary | ICD-10-CM | POA: Diagnosis not present

## 2015-09-15 DIAGNOSIS — N840 Polyp of corpus uteri: Secondary | ICD-10-CM | POA: Diagnosis not present

## 2015-09-15 DIAGNOSIS — R87810 Cervical high risk human papillomavirus (HPV) DNA test positive: Secondary | ICD-10-CM | POA: Diagnosis not present

## 2015-09-15 DIAGNOSIS — Z87891 Personal history of nicotine dependence: Secondary | ICD-10-CM | POA: Insufficient documentation

## 2015-09-15 DIAGNOSIS — R102 Pelvic and perineal pain: Secondary | ICD-10-CM | POA: Diagnosis not present

## 2015-09-15 DIAGNOSIS — R896 Abnormal cytological findings in specimens from other organs, systems and tissues: Secondary | ICD-10-CM | POA: Diagnosis not present

## 2015-09-15 DIAGNOSIS — D219 Benign neoplasm of connective and other soft tissue, unspecified: Secondary | ICD-10-CM | POA: Diagnosis present

## 2015-09-15 DIAGNOSIS — K219 Gastro-esophageal reflux disease without esophagitis: Secondary | ICD-10-CM | POA: Diagnosis not present

## 2015-09-15 DIAGNOSIS — D069 Carcinoma in situ of cervix, unspecified: Secondary | ICD-10-CM | POA: Diagnosis not present

## 2015-09-15 HISTORY — PX: CYSTOSCOPY: SHX5120

## 2015-09-15 HISTORY — PX: LAPAROSCOPIC VAGINAL HYSTERECTOMY WITH SALPINGECTOMY: SHX6680

## 2015-09-15 LAB — PREGNANCY, URINE: Preg Test, Ur: NEGATIVE

## 2015-09-15 SURGERY — HYSTERECTOMY, VAGINAL, LAPAROSCOPY-ASSISTED, WITH SALPINGECTOMY
Anesthesia: General | Site: Bladder | Laterality: Bilateral

## 2015-09-15 MED ORDER — LIDOCAINE-EPINEPHRINE 1 %-1:100000 IJ SOLN
INTRAMUSCULAR | Status: AC
  Filled 2015-09-15: qty 1

## 2015-09-15 MED ORDER — DEXAMETHASONE SODIUM PHOSPHATE 10 MG/ML IJ SOLN
INTRAMUSCULAR | Status: AC
  Filled 2015-09-15: qty 1

## 2015-09-15 MED ORDER — HYDROMORPHONE HCL 1 MG/ML IJ SOLN
0.2500 mg | INTRAMUSCULAR | Status: DC | PRN
  Administered 2015-09-15 (×2): 0.25 mg via INTRAVENOUS

## 2015-09-15 MED ORDER — MIDAZOLAM HCL 2 MG/2ML IJ SOLN
INTRAMUSCULAR | Status: DC | PRN
  Administered 2015-09-15: 2 mg via INTRAVENOUS

## 2015-09-15 MED ORDER — MEPERIDINE HCL 25 MG/ML IJ SOLN: 6.2500 mg | INTRAMUSCULAR | Status: DC | PRN

## 2015-09-15 MED ORDER — ONDANSETRON HCL 4 MG/2ML IJ SOLN: 4.0000 mg | Freq: Once | INTRAMUSCULAR | Status: DC | PRN

## 2015-09-15 MED ORDER — BUPIVACAINE HCL (PF) 0.25 % IJ SOLN
INTRAMUSCULAR | Status: AC
  Filled 2015-09-15: qty 30

## 2015-09-15 MED ORDER — ONDANSETRON HCL 4 MG/2ML IJ SOLN
INTRAMUSCULAR | Status: AC
  Filled 2015-09-15: qty 2

## 2015-09-15 MED ORDER — PHENAZOPYRIDINE HCL 200 MG PO TABS
200.0000 mg | ORAL_TABLET | Freq: Once | ORAL | Status: AC
Start: 2015-09-15 — End: 2015-09-15
  Administered 2015-09-15: 200 mg via ORAL
  Filled 2015-09-15: qty 1

## 2015-09-15 MED ORDER — NEOSTIGMINE METHYLSULFATE 10 MG/10ML IV SOLN
INTRAVENOUS | Status: AC
  Filled 2015-09-15: qty 1

## 2015-09-15 MED ORDER — MORPHINE SULFATE (PF) 4 MG/ML IV SOLN
1.0000 mg | INTRAVENOUS | Status: DC | PRN
  Administered 2015-09-15: 2 mg via INTRAVENOUS
  Filled 2015-09-15: qty 1

## 2015-09-15 MED ORDER — STERILE WATER FOR IRRIGATION IR SOLN
Status: DC | PRN
  Administered 2015-09-15: 1000 mL

## 2015-09-15 MED ORDER — KETOROLAC TROMETHAMINE 30 MG/ML IJ SOLN: 30.0000 mg | Freq: Once | INTRAMUSCULAR | Status: DC

## 2015-09-15 MED ORDER — LACTATED RINGERS IV SOLN
INTRAVENOUS | Status: DC
  Administered 2015-09-15 (×3): via INTRAVENOUS

## 2015-09-15 MED ORDER — GLYCOPYRROLATE 0.2 MG/ML IJ SOLN
INTRAMUSCULAR | Status: AC
  Filled 2015-09-15: qty 3

## 2015-09-15 MED ORDER — LIDOCAINE HCL (CARDIAC) 20 MG/ML IV SOLN
INTRAVENOUS | Status: DC | PRN
  Administered 2015-09-15: 80 mg via INTRAVENOUS

## 2015-09-15 MED ORDER — ONDANSETRON HCL 4 MG/2ML IJ SOLN: 4.0000 mg | Freq: Four times a day (QID) | INTRAMUSCULAR | Status: DC | PRN

## 2015-09-15 MED ORDER — PROPOFOL 10 MG/ML IV BOLUS
INTRAVENOUS | Status: AC
  Filled 2015-09-15: qty 20

## 2015-09-15 MED ORDER — ROCURONIUM BROMIDE 100 MG/10ML IV SOLN
INTRAVENOUS | Status: AC
  Filled 2015-09-15: qty 1

## 2015-09-15 MED ORDER — MIDAZOLAM HCL 2 MG/2ML IJ SOLN
INTRAMUSCULAR | Status: AC
  Filled 2015-09-15: qty 2

## 2015-09-15 MED ORDER — DEXAMETHASONE SODIUM PHOSPHATE 10 MG/ML IJ SOLN
INTRAMUSCULAR | Status: DC | PRN
  Administered 2015-09-15: 5 mg via INTRAVENOUS

## 2015-09-15 MED ORDER — KETOROLAC TROMETHAMINE 30 MG/ML IJ SOLN: 30.0000 mg | Freq: Four times a day (QID) | INTRAMUSCULAR | Status: DC

## 2015-09-15 MED ORDER — KETOROLAC TROMETHAMINE 30 MG/ML IJ SOLN
INTRAMUSCULAR | Status: AC
  Filled 2015-09-15: qty 1

## 2015-09-15 MED ORDER — GLYCOPYRROLATE 0.2 MG/ML IJ SOLN
INTRAMUSCULAR | Status: AC
  Filled 2015-09-15: qty 1

## 2015-09-15 MED ORDER — PROPOFOL 10 MG/ML IV BOLUS
INTRAVENOUS | Status: DC | PRN
  Administered 2015-09-15: 180 mg via INTRAVENOUS

## 2015-09-15 MED ORDER — HYDROMORPHONE HCL 1 MG/ML IJ SOLN
INTRAMUSCULAR | Status: AC
  Filled 2015-09-15: qty 1

## 2015-09-15 MED ORDER — DIPHENHYDRAMINE HCL 25 MG PO CAPS: 50.0000 mg | ORAL_CAPSULE | Freq: Four times a day (QID) | ORAL | Status: DC | PRN

## 2015-09-15 MED ORDER — LACTATED RINGERS IR SOLN
Status: DC | PRN
  Administered 2015-09-15: 3000 mL

## 2015-09-15 MED ORDER — GLYCOPYRROLATE 0.2 MG/ML IJ SOLN
INTRAMUSCULAR | Status: DC | PRN
  Administered 2015-09-15 (×2): 0.2 mg via INTRAVENOUS

## 2015-09-15 MED ORDER — KETOROLAC TROMETHAMINE 30 MG/ML IJ SOLN
30.0000 mg | Freq: Four times a day (QID) | INTRAMUSCULAR | Status: DC
  Administered 2015-09-15 – 2015-09-16 (×2): 30 mg via INTRAVENOUS
  Filled 2015-09-15 (×3): qty 1

## 2015-09-15 MED ORDER — SCOPOLAMINE 1 MG/3DAYS TD PT72
1.0000 | MEDICATED_PATCH | Freq: Once | TRANSDERMAL | Status: DC
  Administered 2015-09-15: 1.5 mg via TRANSDERMAL

## 2015-09-15 MED ORDER — ONDANSETRON HCL 4 MG/2ML IJ SOLN
INTRAMUSCULAR | Status: DC | PRN
  Administered 2015-09-15: 4 mg via INTRAVENOUS

## 2015-09-15 MED ORDER — FENTANYL CITRATE (PF) 250 MCG/5ML IJ SOLN
INTRAMUSCULAR | Status: AC
  Filled 2015-09-15: qty 5

## 2015-09-15 MED ORDER — NEOSTIGMINE METHYLSULFATE 10 MG/10ML IV SOLN
INTRAVENOUS | Status: DC | PRN
  Administered 2015-09-15: 1 mg via INTRAVENOUS

## 2015-09-15 MED ORDER — LIDOCAINE-EPINEPHRINE 1 %-1:100000 IJ SOLN
INTRAMUSCULAR | Status: DC | PRN
  Administered 2015-09-15: 11 mL

## 2015-09-15 MED ORDER — ONDANSETRON HCL 4 MG PO TABS: 4.0000 mg | ORAL_TABLET | Freq: Four times a day (QID) | ORAL | Status: DC | PRN

## 2015-09-15 MED ORDER — KETOROLAC TROMETHAMINE 30 MG/ML IJ SOLN
INTRAMUSCULAR | Status: DC | PRN
  Administered 2015-09-15: 30 mg via INTRAVENOUS

## 2015-09-15 MED ORDER — OXYCODONE-ACETAMINOPHEN 5-325 MG PO TABS
1.0000 | ORAL_TABLET | ORAL | Status: DC | PRN
  Administered 2015-09-15 – 2015-09-16 (×3): 1 via ORAL
  Filled 2015-09-15 (×3): qty 1

## 2015-09-15 MED ORDER — LIDOCAINE HCL (CARDIAC) 20 MG/ML IV SOLN
INTRAVENOUS | Status: AC
  Filled 2015-09-15: qty 5

## 2015-09-15 MED ORDER — FENTANYL CITRATE (PF) 100 MCG/2ML IJ SOLN
INTRAMUSCULAR | Status: DC | PRN
  Administered 2015-09-15 (×3): 50 ug via INTRAVENOUS
  Administered 2015-09-15: 100 ug via INTRAVENOUS

## 2015-09-15 MED ORDER — SCOPOLAMINE 1 MG/3DAYS TD PT72
MEDICATED_PATCH | TRANSDERMAL | Status: DC
Start: 2015-09-15 — End: 2015-09-16
  Administered 2015-09-15: 1.5 mg via TRANSDERMAL
  Filled 2015-09-15: qty 1

## 2015-09-15 MED ORDER — DEXTROSE-NACL 5-0.9 % IV SOLN
INTRAVENOUS | Status: DC
  Administered 2015-09-15 – 2015-09-16 (×2): via INTRAVENOUS

## 2015-09-15 MED ORDER — SCOPOLAMINE 1 MG/3DAYS TD PT72
MEDICATED_PATCH | TRANSDERMAL | Status: AC
  Filled 2015-09-15: qty 1

## 2015-09-15 MED ORDER — BUPIVACAINE HCL (PF) 0.25 % IJ SOLN
INTRAMUSCULAR | Status: DC | PRN
  Administered 2015-09-15: 8 mL

## 2015-09-15 MED ORDER — ROCURONIUM BROMIDE 100 MG/10ML IV SOLN
INTRAVENOUS | Status: DC | PRN
  Administered 2015-09-15: 10 mg via INTRAVENOUS
  Administered 2015-09-15: 40 mg via INTRAVENOUS

## 2015-09-15 SURGICAL SUPPLY — 52 items
APL SRG 38 LTWT LNG FL B (MISCELLANEOUS) ×2
APPLICATOR ARISTA FLEXITIP XL (MISCELLANEOUS) ×2 IMPLANT
CABLE HIGH FREQUENCY MONO STRZ (ELECTRODE) IMPLANT
CLOSURE WOUND 1/4 X3 (GAUZE/BANDAGES/DRESSINGS)
CLOTH BEACON ORANGE TIMEOUT ST (SAFETY) ×4 IMPLANT
CONT PATH 16OZ SNAP LID 3702 (MISCELLANEOUS) ×4 IMPLANT
COVER BACK TABLE 60X90IN (DRAPES) ×4 IMPLANT
COVER MAYO STAND STRL (DRAPES) ×4 IMPLANT
DECANTER SPIKE VIAL GLASS SM (MISCELLANEOUS) ×8 IMPLANT
DRAPE BUTTOCK UNDER FLUID (DRAPE) ×2 IMPLANT
DRAPE SHEET LG 3/4 BI-LAMINATE (DRAPES) ×3 IMPLANT
DRSG COVADERM PLUS 2X2 (GAUZE/BANDAGES/DRESSINGS) ×8 IMPLANT
DRSG OPSITE POSTOP 3X4 (GAUZE/BANDAGES/DRESSINGS) ×3 IMPLANT
DURAPREP 26ML APPLICATOR (WOUND CARE) ×4 IMPLANT
ELECT REM PT RETURN 9FT ADLT (ELECTROSURGICAL) ×4
ELECTRODE REM PT RTRN 9FT ADLT (ELECTROSURGICAL) ×2 IMPLANT
EVACUATOR PREFILTER SMOKE (MISCELLANEOUS) ×2 IMPLANT
EVACUATOR SMOKE 8.L (FILTER) ×4 IMPLANT
GLOVE BIO SURGEON STRL SZ7.5 (GLOVE) ×12 IMPLANT
GLOVE BIOGEL PI IND STRL 7.0 (GLOVE) ×6 IMPLANT
GLOVE BIOGEL PI INDICATOR 7.0 (GLOVE) ×6
HEMOSTAT ARISTA ABSORB 3G PWDR (MISCELLANEOUS) ×3 IMPLANT
LEGGING LITHOTOMY PAIR STRL (DRAPES) ×4 IMPLANT
LIQUID BAND (GAUZE/BANDAGES/DRESSINGS) ×3 IMPLANT
NDL MAYO CATGUT SZ4 TPR NDL (NEEDLE) IMPLANT
NEEDLE MAYO CATGUT SZ4 (NEEDLE) IMPLANT
NS IRRIG 1000ML POUR BTL (IV SOLUTION) ×4 IMPLANT
PACK LAVH (CUSTOM PROCEDURE TRAY) ×4 IMPLANT
PACK ROBOTIC GOWN (GOWN DISPOSABLE) ×4 IMPLANT
PAD OB MATERNITY 4.3X12.25 (PERSONAL CARE ITEMS) ×4 IMPLANT
PAD TRENDELENBURG POSITION (MISCELLANEOUS) ×4 IMPLANT
POUCH LAPAROSCOPIC INSTRUMENT (MISCELLANEOUS) ×2 IMPLANT
SCISSORS LAP 5X35 DISP (ENDOMECHANICALS) IMPLANT
SET CYSTO W/LG BORE CLAMP LF (SET/KITS/TRAYS/PACK) ×3 IMPLANT
SET IRRIG TUBING LAPAROSCOPIC (IRRIGATION / IRRIGATOR) ×4 IMPLANT
SHEARS HARMONIC ACE PLUS 36CM (ENDOMECHANICALS) ×3 IMPLANT
SLEEVE SURGEON STRL (DRAPES) ×2 IMPLANT
SLEEVE XCEL OPT CAN 5 100 (ENDOMECHANICALS) ×4 IMPLANT
SOLUTION ELECTROLUBE (MISCELLANEOUS) IMPLANT
STRIP CLOSURE SKIN 1/4X3 (GAUZE/BANDAGES/DRESSINGS) IMPLANT
SUT PLAIN 4 0 FS 2 27 (SUTURE) ×4 IMPLANT
SUT VIC AB 0 CT1 18XCR BRD8 (SUTURE) ×6 IMPLANT
SUT VIC AB 0 CT1 36 (SUTURE) ×4 IMPLANT
SUT VIC AB 0 CT1 8-18 (SUTURE) ×12
SUT VICRYL 0 TIES 12 18 (SUTURE) ×4 IMPLANT
SUT VICRYL 0 UR6 27IN ABS (SUTURE) ×4 IMPLANT
SYR BULB IRRIGATION 50ML (SYRINGE) ×4 IMPLANT
TOWEL OR 17X24 6PK STRL BLUE (TOWEL DISPOSABLE) ×8 IMPLANT
TRAY FOLEY CATH SILVER 14FR (SET/KITS/TRAYS/PACK) ×4 IMPLANT
TROCAR XCEL NON-BLD 11X100MML (ENDOMECHANICALS) ×4 IMPLANT
TUBING SUCTION BULK 100 FT (MISCELLANEOUS) ×2 IMPLANT
WATER STERILE IRR 1000ML POUR (IV SOLUTION) ×4 IMPLANT

## 2015-09-15 NOTE — Progress Notes (Signed)
Patient ID: Kathryn Larsen, female   DOB: [birth year], 45 y.o.   MRN: [id number] Kathryn Larsen [birth year] [id number]   Day of Surgery s/p Procedure(s): LAPAROSCOPIC ASSISTED VAGINAL HYSTERECTOMY WITH SALPINGECTOMY CYSTOSCOPY  Subjective: Patient reports no complaints, no acute distress, pain severity reported mild, Yes.   taking PO, foley catheter in place, No. voiding, Yes.   ambulating, No. passing flatus  Objective: Vital signs in last 24 hours: Temp:  [97.5 F (36.4 C)-98.3 F (36.8 C)] 98.3 F (36.8 C) (06/06 1704) Pulse Rate:  [50-70] 62 (06/06 1704) Resp:  [9-20] 18 (06/06 1704) BP: (106-121)/(59-84) 121/72 mmHg (06/06 1704) SpO2:  [95 %-100 %] 96 % (06/06 1704)    EXAM General: awake, alert and no distress GI: soft, minimal tenderness, dressing/incisions dry   Assessment: s/p Procedure(s): LAPAROSCOPIC ASSISTED VAGINAL HYSTERECTOMY WITH SALPINGECTOMY CYSTOSCOPY: stable and progressing well  Plan: Continue routine post operative care     [name] MD, 5:06 PM 09/15/2015

## 2015-09-15 NOTE — Anesthesia Postprocedure Evaluation (Signed)
Anesthesia Post Note  Patient: Kathryn Larsen  Procedure(s) Performed: Procedure(s) (LRB): LAPAROSCOPIC ASSISTED VAGINAL HYSTERECTOMY WITH SALPINGECTOMY (Bilateral) CYSTOSCOPY  Patient location during evaluation: [facility] Anesthesia Type: General Level of consciousness: awake and alert and oriented Pain management: pain level controlled Vital Signs Assessment: post-procedure vital signs reviewed and stable Respiratory status: spontaneous breathing and nonlabored ventilation Cardiovascular status: stable Postop Assessment: no signs of nausea or vomiting and adequate PO intake Anesthetic complications: no     Last Vitals:  Filed Vitals:   09/15/15 1337 09/15/15 1432  BP: 110/73 111/59  Pulse: 57 53  Temp: 36.8 C   Resp:  16    Last Pain:  Filed Vitals:   09/15/15 1444  PainSc: 3    Pain Goal: Patients Stated Pain Goal: 3 (09/15/15 1345)               Danelia Snodgrass Hristova

## 2015-09-15 NOTE — Transfer of Care (Signed)
Immediate Anesthesia Transfer of Care Note  Patient: Kathryn Larsen  Procedure(s) Performed: Procedure(s): LAPAROSCOPIC ASSISTED VAGINAL HYSTERECTOMY WITH SALPINGECTOMY (Bilateral) CYSTOSCOPY  Patient Location: PACU  Anesthesia Type:General  Level of Consciousness: awake  Airway & Oxygen Therapy: Patient Spontanous Breathing  Post-op Assessment: Report given to PACU RN  Post vital signs: stable  Filed Vitals:   09/15/15 0838  BP: 116/84  Pulse: 70  Temp: 36.8 C  Resp: 20    Complications: No apparent anesthesia complications

## 2015-09-15 NOTE — H&P (Signed)
  The patient was examined.  I reviewed the proposed surgery and consent form with the patient.  The dictated history and physical is current and accurate and all questions were answered. The patient is ready to proceed with surgery and has a realistic understanding and expectation for the outcome.   [name] MD, 9:33 AM 09/15/2015

## 2015-09-15 NOTE — Anesthesia Preprocedure Evaluation (Signed)
Anesthesia Evaluation  Patient identified by MRN, date of birth, ID band Patient awake    Reviewed: Allergy & Precautions, H&P , NPO status , Patient's Chart, lab work & pertinent test results  Airway Mallampati: I  TM Distance: >3 FB Neck ROM: full    Dental no notable dental hx. (+) Teeth Intact   Pulmonary former smoker,    Pulmonary exam normal        Cardiovascular negative cardio ROS Normal cardiovascular exam     Neuro/Psych negative psych ROS   GI/Hepatic Neg liver ROS, GERD  Medicated and Controlled,  Endo/Other  negative endocrine ROS  Renal/GU negative Renal ROS     Musculoskeletal   Abdominal Normal abdominal exam  (+)   Peds  Hematology negative hematology ROS (+)   Anesthesia Other Findings   Reproductive/Obstetrics negative OB ROS                            Anesthesia Physical  Anesthesia Plan  ASA: II  Anesthesia Plan: General   Post-op Pain Management:    Induction: Intravenous  Airway Management Planned: Oral ETT  Additional Equipment:   Intra-op Plan:   Post-operative Plan: Extubation in OR  Informed Consent: I have reviewed the patients History and Physical, chart, labs and discussed the procedure including the risks, benefits and alternatives for the proposed anesthesia with the patient or authorized representative who has indicated his/her understanding and acceptance.   Dental Advisory Given  Plan Discussed with: CRNA and Surgeon  Anesthesia Plan Comments:         Anesthesia Quick Evaluation

## 2015-09-15 NOTE — Op Note (Signed)
Kathryn Larsen [birth year] [id number]   Post Operative Note   Date of surgery:  09/15/2015  Pre Op Dx:  Pelvic pain, hematometria, endometrial polyp, cervical scarring, LGSIL, positive high-risk HPV  Post Op Dx:  Same  Procedure:  Laparoscopic-assisted vaginal hysterectomy, bilateral salpingectomies, cystoscopy  Surgeon:  [name]  Assistant:  Uvaldo Rising  Anesthesia:  General  EBL:  123XX123 cc  Complications:  None  Specimen:  Uterus with attached fallopian tubes to pathology  Findings: EUA:  External BUS vagina normal. Cervix normal. Uterus normal size midline mobile. Adnexa without masses   Operative:  Anterior cul-de-sac with vesicouterine peritoneal scarring consistent with history of cesarean section. Posterior cul-de-sac normal. Uterus normal size shape and contour. Right and left fallopian tubes normal length, caliber and fimbriated ends. Right and left ovaries grossly normal free and mobile. Upper abdominal exam shows appendix grossly normal free and mobile. Liver smooth without abnormalities. Gallbladder grossly normal to limited inspection. No upper abdominal pathology noted.    Cystoscopy: Adequate noting bilateral ureteral jets, normal appearing bladder mucosa and no evidence of bladder injury.   Procedure:  Patient was taken to the operating room, underwent general anesthesia, was placed in the low dorsal lithotomy position, received a DuraPrep abdominal preparation and a Betadine perineal/vaginal preparation by nursing personnel and the Foley catheter was placed in sterile technique. The timeout was performed by the surgical team and a Hulka tenaculum was placed on the cervix and the patient was draped in usual fashion. A transverse infraumbilical incision was made and using the 10 mm direct entry Opti-Vu type laparoscopic trocar the abdomen was directly entered under direct visualization without difficulty and subsequently insufflated. Right and left 5 mm  suprapubic ports were then placed under direct visualization after transillumination of the vessels without difficulty. Examination the pelvic organs and upper abdominal exam was carried out with findings noted above. The left fallopian tube was elevated and using the harmonic scalpel along the mesosalpinx the looking tube was freed from its attachments to the level of the uterine serosa. Attention was given to avoid the infundibulopelvic vasculature. The ureter was identified away from the site. The left uterine ovarian pedicle was then transected with the harmonic scalpel as was the round ligament and the remaining broad ligament incised to the level of the lower uterine segment. The anterior vesicouterine peritoneal fold was then incised using the Harmonic scalpel to the midline. A similar procedure was then carried out on the other side. Attention was then turned to the vaginal portion of the procedure and the patient was placed in the dorsal lithotomy position, a weighted speculum placed, the cervix grasped with a tenaculum and the cervical mucosa was circumferentially injected using 1% lidocaine and 1:100,000 epinephrine mixture a total of 11 cc.  The cervical mucosa was and sharply incised and the paracervical planes were sharply developed without difficulty. The posterior cul-de-sac was then sharply entered without difficulty along weighted speculum was placed. The anterior vesicouterine plane was developed sharply and bluntly until the anterior cul-de-sac was entered without difficulty. The right uterosacral ligament was then clamped cut and ligated using 0 Vicryl suture and tagged for future reference. A similar procedure was carried out on the other side. The uterus was then progressively freed from its attachments to clamping cutting and ligating of the paracervical/parametrial tissues using 0 Vicryl suture until ultimately the uterus was delivered through the vagina clamping the remaining attachments.  The long weighted speculum was replaced with a shorter weighted speculum, a  tagged tail sponge was placed to hold the intestines from the older sac and the posterior vaginal cuff was run from uterosacral ligament to uterosacral ligament using 0 Vicryl suture in running interlocking stitch. The tail sponge was removed, the pelvis irrigated showing adequate hemostasis and the vagina was closed anterior to posterior using 0 Vicryl suture in interrupted figure-of-eight stitch.  Attention was then turned to the cystoscopy and the Foley catheter was removed and cystoscopy performed which showed good ureteral jets bilaterally and no evidence of bladder mucosal damage. The Foley catheter was replaced, the patient placed in the low dorsal lithotomy position, the surgical team regloved and regowned and the abdomen was reinsufflated, copiously irrigated and reinspected showing adequate hemostasis at all surgical sites.  Arista was prophylactically applied to the vaginal cuff, the gas slowly allowed to escape with reinspection under low pressure situation showing continued hemostasis and the right and left suprapubic ports were removed under direct visualization with adequate hemostasis. The infra umbilical port was backed out under direct visualization showing adequate hemostasis and no evidence of hernia formation. The 0 Vicryl interrupted subcutaneous fascial stitch was placed infraumbilically, the skin reapproximated using 4-0 plain suture in a running subcuticular stitch and all skin incisions closed using LiquiBand skin adhesive. The patient was given intraoperative Toradol, was awakened without difficulty and taken to recovery room in good condition having tolerated procedure well.     [name] MD, 1:18 PM 09/15/2015

## 2015-09-15 NOTE — Anesthesia Procedure Notes (Signed)
Procedure Name: Intubation Date/Time: 09/15/2015 10:09 AM Performed by: Casimer Lanius A Pre-anesthesia Checklist: Patient identified, Patient being monitored, Timeout performed, Emergency Drugs available and Suction available Patient Re-evaluated:Patient Re-evaluated prior to inductionOxygen Delivery Method: Circle System Utilized Preoxygenation: Pre-oxygenation with 100% oxygen Intubation Type: IV induction and Inhalational induction Ventilation: Mask ventilation without difficulty Laryngoscope Size: Mac and 3 Grade View: Grade II Tube type: Oral Tube size: 7.0 mm Number of attempts: 1 Airway Equipment and Method: stylet Placement Confirmation: ETT inserted through vocal cords under direct vision,  positive ETCO2 and breath sounds checked- equal and bilateral Secured at: 20 cm Tube secured with: Tape Dental Injury: Teeth and Oropharynx as per pre-operative assessment

## 2015-09-16 ENCOUNTER — Encounter (HOSPITAL_COMMUNITY): Payer: Self-pay | Admitting: Gynecology

## 2015-09-16 DIAGNOSIS — D069 Carcinoma in situ of cervix, unspecified: Secondary | ICD-10-CM | POA: Diagnosis not present

## 2015-09-16 LAB — CBC
HCT: 33.9 % — ABNORMAL LOW (ref 36.0–46.0)
HEMOGLOBIN: 11 g/dL — AB (ref 12.0–15.0)
MCH: 29.9 pg (ref 26.0–34.0)
MCHC: 32.4 g/dL (ref 30.0–36.0)
MCV: 92.1 fL (ref 78.0–100.0)
Platelets: 197 10*3/uL (ref 150–400)
RBC: 3.68 MIL/uL — AB (ref 3.87–5.11)
RDW: 12.9 % (ref 11.5–15.5)
WBC: 9.6 10*3/uL (ref 4.0–10.5)

## 2015-09-16 MED ORDER — OXYCODONE-ACETAMINOPHEN 5-325 MG PO TABS: 1.0000 | ORAL_TABLET | ORAL | Status: DC | PRN

## 2015-09-16 NOTE — Anesthesia Postprocedure Evaluation (Signed)
Anesthesia Post Note  Patient: Kathryn Larsen  Procedure(s) Performed: Procedure(s) (LRB): LAPAROSCOPIC ASSISTED VAGINAL HYSTERECTOMY WITH SALPINGECTOMY (Bilateral) CYSTOSCOPY  Patient location during evaluation: PACU Anesthesia Type: General Level of consciousness: awake Pain management: pain level controlled Vital Signs Assessment: vitals unstable Respiratory status: spontaneous breathing Cardiovascular status: stable Postop Assessment: no signs of nausea or vomiting Anesthetic complications: no     Last Vitals:  Filed Vitals:   09/16/15 0149 09/16/15 0615  BP: 127/78 119/63  Pulse: 61 61  Temp: 36.7 C 36.7 C  Resp: 18 18    Last Pain:  Filed Vitals:   09/16/15 0658  PainSc: Asleep   Pain Goal: Patients Stated Pain Goal: 3 (09/16/15 0400)               Chandi Nicklin JR,JOHN Mateo Flow

## 2015-09-16 NOTE — Progress Notes (Signed)
Pt. Is discharged in the care of husband,with N.T. Escort. Denies pain or discomfort.  Understands all discharged instructions well. Questions were asked and answered.  Abdominal dressing is clean and dry. No equipment needed for home use. Stable. No distress.

## 2015-09-16 NOTE — Discharge Instructions (Signed)
°  Postoperative Instructions Hysterectomy ° °Dr. Kearra Calkin and the nursing staff have discussed postoperative instructions with you.  If you have any questions please ask them before you leave the hospital, or call Dr Pegah Segel’s office at [phone].   ° °We would like to emphasize the following instructions: ° ° °  Call the office to make your follow-up appointment as recommended by Dr Saryiah Bencosme (usually 2 weeks). ° °  You were given a prescription, or one was ordered for you at the pharmacy you designated.  Get that prescription filled and take the medication according to instructions. ° °  You may eat a regular diet, but slowly until you start having bowel movements. ° °  Drink plenty of water daily. ° °  Nothing in the vagina (intercourse, douching, objects of any kind) until released by Dr Sarayu Prevost. ° °  No driving for two weeks.  Wait to be cleared by Dr Josafat Enrico at your first post op check.  Car rides (short) are ok after several days at home, as long as you are not having significant pain, but no traveling out of town. ° °  You may shower, but no baths.  Walking up and down stairs is ok.  No heavy lifting, prolonged standing, repeated bending or any “working out” until your first post op check. ° °  Rest frequently, listen to your body and do not push yourself and overdo it. ° °  Call if: ° °o Your pain medication does not seem strong enough. °o Worsening pain or abdominal bloating °o Persistent nausea or vomiting °o Difficulty with urination or bowel movements. °o Temperature of 101 degrees or higher. °o Bleeding heavier then staining (clots or period type flow). °o Incisions become red, tender or begin to drain. °o You have any questions or concerns. °

## 2015-09-16 NOTE — Progress Notes (Signed)
Patient ID: Kathryn Larsen, female   DOB: 1971-02-11, 45 y.o.   MRN: [id number] Kathryn Larsen [birth year] [id number]   1 Day Post-Op Procedure(s) (LRB): LAPAROSCOPIC ASSISTED VAGINAL HYSTERECTOMY WITH SALPINGECTOMY (Bilateral) CYSTOSCOPY  Subjective: Patient reports feels well, no acute distress, pain severity reported mild, Yes.   taking PO, foley catheter in place, No. voiding, Yes.   ambulating, Yes.   passing flatus  Objective: Vital signs in last 24 hours: Temp:  [97.5 F (36.4 C)-98.5 F (36.9 C)] 98.1 F (36.7 C) (06/07 0615) Pulse Rate:  [50-70] 61 (06/07 0615) Resp:  [9-20] 18 (06/07 0615) BP: (106-127)/(59-84) 119/63 mmHg (06/07 0615) SpO2:  [95 %-100 %] 98 % (06/07 0615) Last BM Date: 09/14/15    EXAM General: awake, alert and no distress Resp: clear to auscultation bilaterally Cardio: regular rate and rhythm GI: soft, non tender, bowel sounds active, incisions dry intact Lower Extremities: Without swelling or tenderness Vaginal Bleeding: Reported scant   Lab Results:   Recent Labs  09/16/15 0525  WBC 9.6  HGB 11.0*  HCT 33.9*  PLT 197    Assessment: s/p Procedure(s): LAPAROSCOPIC ASSISTED VAGINAL HYSTERECTOMY WITH SALPINGECTOMY CYSTOSCOPY: progressing well, ready for discharge.   Plan: D/C foley, IV.  Discharge home today.  Precautions, instructions and follow up were discussed with the patient.  Prescriptions provided per AVS.  Patient to call the office to arrange a post-operative appointmant in 2 weeks.    [name] MD, 7:15 AM 09/16/2015

## 2015-09-22 NOTE — Discharge Summary (Signed)
  Kathryn Larsen 10-02-1970 [id number]   Discharge Summary  Date of Admission:  09/15/2015  Date of Discharge:  09/16/2015  Discharge Diagnosis:  Pelvic pain, hematometria, HGSIL, positive high-risk HPV  Procedure:  Procedure(s): LAPAROSCOPIC ASSISTED VAGINAL HYSTERECTOMY WITH SALPINGECTOMY CYSTOSCOPY  Pathology:  Uterus and bilateral fallopian tubes, cervix CERVIX: SQUAMOUS CARCINOMA IN SITU; HIGH GRADE SQUAMOUS INTRAEPITHELIAL LESION, CIN-III NO INVASIVE NEOPLASM IDENTIFIED. MARGINS OF RESECTION ARE NEGATIVE FOR NEOPLASIA UNREMARKABLE ENDOMETRIAL STROMA WITH FIBROSIS BILATERAL FALLOPIAN TUBES: HISTOLOGICAL UNREMARKABLE  Hospital Course:  Patient underwent an uncomplicated LAVH, bilateral salpingectomies 09/15/2015. She was discharged on postoperative day #1 and doing well, tolerating regular diet, voiding without difficulty with good pain relief on oral medication. The patient received instructions for postoperative care and call precautions.  She received prescriptions per AVS and will be seen in the office 2 weeks following discharge.       [name] MD, 10:26 AM 09/22/2015

## 2015-09-29 ENCOUNTER — Encounter: Payer: Self-pay | Admitting: Gynecology

## 2015-09-29 ENCOUNTER — Ambulatory Visit (INDEPENDENT_AMBULATORY_CARE_PROVIDER_SITE_OTHER): Payer: 59 | Admitting: Gynecology

## 2015-09-29 ENCOUNTER — Telehealth: Payer: Self-pay

## 2015-09-29 VITALS — BP 118/74

## 2015-09-29 DIAGNOSIS — Z9889 Other specified postprocedural states: Secondary | ICD-10-CM

## 2015-09-29 NOTE — Progress Notes (Signed)
    Kathryn Larsen 31-Aug-1970 [id number]        45 y.o.  [id number] presents for her two-week postoperative visit status post LAVH bilateral salpingectomies for pelvic pain, endometrial polyp/hematometria, LGSIL. Reports doing well with no significant discomfort.  Past medical history,surgical history, problem list, medications, allergies, family history and social history were all reviewed and documented in the EPIC chart.  Directed ROS with pertinent positives and negatives documented in the history of present illness/assessment and plan.  Exam: [name] assistant Filed Vitals:   09/29/15 1415  BP: 118/74   General appearance:  Normal Abdomen soft nontender without masses guarding rebound. Incisions healed nicely. Pelvic external BUS vagina normal with cuff healing nicely. Bimanual without masses or tenderness  Assessment/Plan:  45 y.o. [id number] with normal two-week postoperative visit status post LAVH bilateral salpingectomies. Reviewed findings at the time of surgery and pathology which showed carcinoma in situ of the cervix, clear margins. Endometrial scarring noted but no significant polyp or other changes. Patient will slowly resume activities with the exception of continued pelvic rest and follow up in 2 weeks for her next postoperative visit.    [name] MD, 2:22 PM 09/29/2015

## 2015-09-29 NOTE — Telephone Encounter (Signed)
Patient brought FMLA forms today. She would like forms to reflect return to work date of 10/19/15 which is 5 weeks post op.  Ok?

## 2015-09-29 NOTE — Patient Instructions (Signed)
Follow-up in 2 weeks for your next postoperative visit. 

## 2015-09-29 NOTE — Telephone Encounter (Signed)
Okay 

## 2015-09-29 NOTE — Telephone Encounter (Signed)
Form completed and faxed to disability co. Patient informed.

## 2015-10-12 ENCOUNTER — Encounter: Payer: Self-pay | Admitting: Gynecology

## 2015-10-12 ENCOUNTER — Ambulatory Visit (INDEPENDENT_AMBULATORY_CARE_PROVIDER_SITE_OTHER): Payer: 59 | Admitting: Gynecology

## 2015-10-12 VITALS — BP 120/78 | Ht 66.0 in | Wt 179.0 lb

## 2015-10-12 DIAGNOSIS — Z9889 Other specified postprocedural states: Secondary | ICD-10-CM

## 2015-10-12 NOTE — Progress Notes (Signed)
    Kathryn Larsen [birth year] [id number]        45 y.o.  [id number] presents for her 4 week postoperative visit status post LAVH bilateral salpingectomies. Doing well without complaints.  Past medical history,surgical history, problem list, medications, allergies, family history and social history were all reviewed and documented in the EPIC chart.  Directed ROS with pertinent positives and negatives documented in the history of present illness/assessment and plan.  Exam: Kathryn Larsen assistant Filed Vitals:   10/12/15 1030  BP: 120/78  Height: 5\' 6"  (1.676 m)  Weight: 179 lb (81.194 kg)   General appearance:  Normal Abdomen soft nontender without masses guarding rebound. Incisions healed nicely. Pelvic external BUS vagina with cuff healing nicely. Suture line intact. Bimanual without masses or tenderness.  Assessment/Plan:  45 y.o. [id number] with normal postoperative visit. Continue to slowly resume normal activities with the exception of pelvic rest. Follow up in 2 weeks for her next/final postoperative visit.  Sooner if any issues.    [name] MD, 10:56 AM 10/12/2015

## 2015-10-12 NOTE — Patient Instructions (Signed)
Follow-up in 2 weeks for your next postoperative visit. 

## 2015-11-02 ENCOUNTER — Encounter: Payer: Self-pay | Admitting: Gynecology

## 2015-11-02 ENCOUNTER — Ambulatory Visit (INDEPENDENT_AMBULATORY_CARE_PROVIDER_SITE_OTHER): Payer: 59 | Admitting: Gynecology

## 2015-11-02 VITALS — BP 114/74

## 2015-11-02 DIAGNOSIS — Z9889 Other specified postprocedural states: Secondary | ICD-10-CM

## 2015-11-02 NOTE — Progress Notes (Signed)
    Kathryn Larsen 01-08-1971 [id number]        45 y.o.  [id number] presents for final postoperative visit status post LAVH bilateral salpingectomies. Has done well without complaints.  Past medical history,surgical history, problem list, medications, allergies, family history and social history were all reviewed and documented in the EPIC chart.  Directed ROS with pertinent positives and negatives documented in the history of present illness/assessment and plan.  Exam: [name] assistant Vitals:   11/02/15 1123  BP: 114/74   General appearance:  Normal Abdominal incisions well healed. No masses guarding rebound Pelvic external BUS vagina with several small areas of granulation tissue at the cuff. Silver nitrate applied. Bimanual without masses or tenderness.  Assessment/Plan:  45 y.o. [id number] with normal postoperative visit. Several small areas of granulation tissue treated with silver nitrate. Will slowly resume all normal activities. Follow up February/March 2018 when due for annual exam, sooner if any issues.    [name] MD, 12:20 PM 11/02/2015

## 2015-11-02 NOTE — Patient Instructions (Signed)
Follow up February/March 2018 when due for annual exam. Sooner if any issues.

## 2016-03-21 ENCOUNTER — Encounter: Payer: Self-pay | Admitting: Physician Assistant

## 2016-04-28 ENCOUNTER — Other Ambulatory Visit: Payer: Self-pay | Admitting: Gynecology

## 2016-04-28 DIAGNOSIS — Z1231 Encounter for screening mammogram for malignant neoplasm of breast: Secondary | ICD-10-CM

## 2016-06-03 ENCOUNTER — Ambulatory Visit (INDEPENDENT_AMBULATORY_CARE_PROVIDER_SITE_OTHER): Payer: 59 | Admitting: Gynecology

## 2016-06-03 ENCOUNTER — Encounter: Payer: Self-pay | Admitting: Gynecology

## 2016-06-03 VITALS — BP 124/80 | Ht 66.5 in | Wt 187.0 lb

## 2016-06-03 DIAGNOSIS — R87810 Cervical high risk human papillomavirus (HPV) DNA test positive: Secondary | ICD-10-CM | POA: Diagnosis not present

## 2016-06-03 DIAGNOSIS — Z1151 Encounter for screening for human papillomavirus (HPV): Secondary | ICD-10-CM

## 2016-06-03 DIAGNOSIS — Z01419 Encounter for gynecological examination (general) (routine) without abnormal findings: Secondary | ICD-10-CM

## 2016-06-03 NOTE — Progress Notes (Signed)
    Kathryn Larsen December 26, 1970 [id number]        46 y.o.  [id number] for annual exam.    Past medical history,surgical history, problem list, medications, allergies, family history and social history were all reviewed and documented as reviewed in the EPIC chart.  ROS:  Performed with pertinent positives and negatives included in the history, assessment and plan.   Additional significant findings :  None   Exam: Gilman Schmidt assistant Vitals:   06/03/16 1206  BP: 124/80  Weight: 187 lb (84.8 kg)  Height: 5' 6.5" (1.689 m)   Body mass index is 29.73 kg/m.  General appearance:  Normal affect, orientation and appearance. Skin: Grossly normal HEENT: Without gross lesions.  No cervical or supraclavicular adenopathy. Thyroid normal.  Lungs:  Clear without wheezing, rales or rhonchi Cardiac: RR, without RMG Abdominal:  Soft, nontender, without masses, guarding, rebound, organomegaly or hernia Breasts:  Examined lying and sitting without masses, retractions, discharge or axillary adenopathy. Pelvic:  Ext, BUS, Vagina: Normal. Pap smear/HPV of cuff  Adnexa: Without masses or tenderness    Anus and perineum: Normal   Rectovaginal: Normal sphincter tone without palpated masses or tenderness.    Assessment/Plan:  46 y.o. [id number] female for annual exam.   1. Status LAVH bilateral salpingectomies 2017 for persistent pelvic pain, endometrial polyp, hematometria and persistent LGSIL/positive HPV.. Final pathology showed high-grade dysplasia with clear margins. Pap smear/HPV done of vaginal cuff today. 2. Mammography scheduled next week. SBE monthly reviewed. 3. Notes some vaginal dryness with intercourse. Uses lubricants with good results. No hot flushes night sweats or dryness otherwise. We'll monitor for now. 4. Health maintenance. Patient reports routine lab work done elsewhere. Follow up 1 year, sooner as needed.   [name] MD, 12:26 PM 06/03/2016

## 2016-06-03 NOTE — Addendum Note (Signed)
Addended by: Nelva Nay on: 06/03/2016 12:38 PM   Modules accepted: Orders

## 2016-06-03 NOTE — Patient Instructions (Signed)

## 2016-06-06 LAB — PAP IG AND HPV HIGH-RISK: HPV DNA HIGH RISK: NOT DETECTED

## 2016-06-07 ENCOUNTER — Encounter: Payer: Self-pay | Admitting: Gynecology

## 2016-06-08 ENCOUNTER — Ambulatory Visit
Admission: RE | Admit: 2016-06-08 | Discharge: 2016-06-08 | Disposition: A | Discharge status: Home / Self Care | Payer: 59 | Source: Ambulatory Visit | Attending: Gynecology | Admitting: Gynecology

## 2016-06-08 DIAGNOSIS — Z1231 Encounter for screening mammogram for malignant neoplasm of breast: Secondary | ICD-10-CM

## 2016-07-11 ENCOUNTER — Encounter: Payer: Self-pay | Admitting: Physician Assistant

## 2016-07-11 ENCOUNTER — Ambulatory Visit (INDEPENDENT_AMBULATORY_CARE_PROVIDER_SITE_OTHER): Payer: 59 | Admitting: Physician Assistant

## 2016-07-11 VITALS — BP 118/76 | HR 74 | Temp 97.7°F | Resp 14 | Ht 66.0 in | Wt 195.0 lb

## 2016-07-11 DIAGNOSIS — L309 Dermatitis, unspecified: Secondary | ICD-10-CM

## 2016-07-11 DIAGNOSIS — Z6831 Body mass index (BMI) 31.0-31.9, adult: Secondary | ICD-10-CM

## 2016-07-11 DIAGNOSIS — R6889 Other general symptoms and signs: Secondary | ICD-10-CM | POA: Diagnosis not present

## 2016-07-11 DIAGNOSIS — Z1389 Encounter for screening for other disorder: Secondary | ICD-10-CM

## 2016-07-11 DIAGNOSIS — Z79899 Other long term (current) drug therapy: Secondary | ICD-10-CM | POA: Diagnosis not present

## 2016-07-11 DIAGNOSIS — E6609 Other obesity due to excess calories: Secondary | ICD-10-CM

## 2016-07-11 DIAGNOSIS — Q2112 Patent foramen ovale: Secondary | ICD-10-CM

## 2016-07-11 DIAGNOSIS — E559 Vitamin D deficiency, unspecified: Secondary | ICD-10-CM

## 2016-07-11 DIAGNOSIS — R5383 Other fatigue: Secondary | ICD-10-CM

## 2016-07-11 DIAGNOSIS — Z13220 Encounter for screening for lipoid disorders: Secondary | ICD-10-CM

## 2016-07-11 DIAGNOSIS — Z0001 Encounter for general adult medical examination with abnormal findings: Secondary | ICD-10-CM | POA: Diagnosis not present

## 2016-07-11 DIAGNOSIS — Z23 Encounter for immunization: Secondary | ICD-10-CM | POA: Diagnosis not present

## 2016-07-11 DIAGNOSIS — K219 Gastro-esophageal reflux disease without esophagitis: Secondary | ICD-10-CM

## 2016-07-11 DIAGNOSIS — Q211 Atrial septal defect: Secondary | ICD-10-CM | POA: Diagnosis not present

## 2016-07-11 DIAGNOSIS — Z0000 Encounter for general adult medical examination without abnormal findings: Secondary | ICD-10-CM

## 2016-07-11 LAB — BASIC METABOLIC PANEL WITH GFR
BUN: 15 mg/dL (ref 7–25)
CO2: 24 mmol/L (ref 20–31)
Calcium: 9.8 mg/dL (ref 8.6–10.2)
Chloride: 103 mmol/L (ref 98–110)
Creat: 0.94 mg/dL (ref 0.50–1.10)
GFR, EST AFRICAN AMERICAN: 85 mL/min (ref >=60)
GFR, Est Non African American: 74 mL/min (ref >=60)
Glucose, Bld: 90 mg/dL (ref 65–99)
POTASSIUM: 4.4 mmol/L (ref 3.5–5.3)
Sodium: 138 mmol/L (ref 135–146)

## 2016-07-11 LAB — LIPID PANEL
Cholesterol: 251 mg/dL — ABNORMAL HIGH (ref <200)
HDL: 64 mg/dL (ref >50)
LDL CALC: 165 mg/dL — AB (ref <100)
TRIGLYCERIDES: 109 mg/dL (ref <150)
Total CHOL/HDL Ratio: 3.9 Ratio (ref <5.0)
VLDL: 22 mg/dL (ref <30)

## 2016-07-11 LAB — CBC WITH DIFFERENTIAL/PLATELET
BASOS PCT: 0 %
Basophils Absolute: 0 cells/uL (ref 0–200)
EOS PCT: 2 %
Eosinophils Absolute: 130 cells/uL (ref 15–500)
HCT: 42.9 % (ref 35.0–45.0)
Hemoglobin: 14 g/dL (ref 11.7–15.5)
LYMPHS PCT: 37 %
Lymphs Abs: 2405 cells/uL (ref 850–3900)
MCH: 29.9 pg (ref 27.0–33.0)
MCHC: 32.6 g/dL (ref 32.0–36.0)
MCV: 91.7 fL (ref 80.0–100.0)
MONOS PCT: 6 %
MPV: 11.4 fL (ref 7.5–12.5)
Monocytes Absolute: 390 cells/uL (ref 200–950)
Neutro Abs: 3575 cells/uL (ref 1500–7800)
Neutrophils Relative %: 55 %
PLATELETS: 262 10*3/uL (ref 140–400)
RBC: 4.68 MIL/uL (ref 3.80–5.10)
RDW: 12.9 % (ref 11.0–15.0)
WBC: 6.5 10*3/uL (ref 3.8–10.8)

## 2016-07-11 LAB — HEPATIC FUNCTION PANEL
ALBUMIN: 4.3 g/dL (ref 3.6–5.1)
ALK PHOS: 71 U/L (ref 33–115)
ALT: 19 U/L (ref 6–29)
AST: 17 U/L (ref 10–35)
BILIRUBIN TOTAL: 0.5 mg/dL (ref 0.2–1.2)
Bilirubin, Direct: 0.1 mg/dL (ref <=0.2)
Indirect Bilirubin: 0.4 mg/dL (ref 0.2–1.2)
Total Protein: 7 g/dL (ref 6.1–8.1)

## 2016-07-11 LAB — IRON AND TIBC
%SAT: 36 % (ref 11–50)
IRON: 138 ug/dL (ref 40–190)
TIBC: 381 ug/dL (ref 250–450)
UIBC: 243 ug/dL (ref 125–400)

## 2016-07-11 NOTE — Patient Instructions (Addendum)
VAGINAL DRYNESS OVERVIEW  Vaginal dryness, also known as atrophic vaginitis, is a common condition in postmenopausal women. This condition is also common in women who have had both ovaries removed at the time of hysterectomy.   Some women have uncomfortable symptoms of vaginal dryness, such as pain with sex, burning vaginal discomfort or itching, or abnormal vaginal discharge, while others have no symptoms at all.  VAGINAL DRYNESS CAUSES   Estrogen helps to keep the vagina moist and to maintain thickness of the vaginal lining. Vaginal dryness occurs when the ovaries produce a decreased amount of estrogen. This can occur at certain times in a woman's life, and may be permanent or temporary. Times when less estrogen is made include: ?At the time of menopause. ?After surgical removal of the ovaries, chemotherapy, or radiation therapy of the pelvis for cancer. ?After having a baby, particularly in women who breastfeed. ?While using certain medications, such as danazol, medroxyprogesterone (brand names: Provera or DepoProvera), leuprolide (brand name: Lupron), or nafarelin. When these medications are stopped, estrogen production resumes.  Women who smoke cigarettes have been shown to have an increased risk of an earlier menopause transition as compared to non-smokers. Therefore, atrophic vaginitis symptoms may appear at a younger age in this population.  VAGINAL DRYNESS TREATMENT   There are three treatment options for women with vaginal dryness:  Vaginal lubricants and moisturizers - Vaginal lubricants and moisturizers can be purchased without a prescription. These products do not contain any hormones and have virtually no side effects. - Albolene is found in the facial cleanser section at [facility], [facility], or [employer]. It is a large jar with a blue top. This is the best lubricant for women because it is hypoallergenic. -Natural lubricants, such as olive, avocado or peanut oil, are easily available  products that may be used as a lubricant with sex.  -Vaginal moisturizes (eg, Replens, Moist Again, Vagisil, K-Y Silk-E, and Feminease) are formulated to allow water to be retained in the vaginal tissues. Moisturizers are applied into the vagina three times weekly to allow a continued moisturizing effect. These should not be used just before having sex, as they can be irritating.  Vaginal estrogen - Vaginal estrogen is the most effective treatment option for women with vaginal dryness. Vaginal estrogen must be prescribed by a healthcare provider. Very low doses of vaginal estrogen can be used when it is put into the vagina to treat vaginal dryness. A small amount of estrogen is absorbed into the bloodstream, but only about 100 times less than when using estrogen pills or tablets. As a result, there is a much lower risk of side effects, such as blood clots, breast cancer, and heart attack, compared with other estrogen-containing products (birth control pills, menopausal hormone therapy).   Ospemifene - Ospemifene is a prescription medication that is similar to estrogen, but is not estrogen. In the vaginal tissue, it acts similarly to estrogen. In the breast tissue, it acts as an estrogen blocker. It comes in a pill, and is prescribed for women who want to use an estrogen-like medication for vaginal dryness or painful sex associated with vaginal dryness, but prefer not to use a vaginal medication. The medication may cause hot flashes as a side effect. This type of medication may increase the risk of blood clots or uterine cancer. Further study of ospemifene is needed to evaluate the risk of these complications. This medication has not been tested in women who have had breast cancer or are at a high risk of developing   breast cancer.    Sexual activity - Vaginal estrogen improves vaginal dryness quickly, usually within a few weeks. You may continue to have sex as you treat vaginal dryness because sex itself  can help to keep the vaginal tissues healthy. Vaginal intercourse may help the vaginal tissues by keeping them soft and stretchable and preventing the tissues from shrinking.  If sex continues to be painful despite treatment for vaginal dryness, talk to your healthcare provider.    GETTING OFF OF PPI's    Nexium/protonix/prilosec/Omeprazole/Dexilant/Aciphex are called PPI's, they are great at healing your stomach but should only be taken for a short period of time.     Recent studies have shown that taken for a long time they  can increase the risk of osteoporosis (weakening of your bones), pneumonia, low magnesium, restless legs, Cdiff (infection that causes diarrhea), DEMENTIA and most recently kidney damage / disease / insufficiency.     Due to this information we want to try to stop the PPI but if you try to stop it abruptly this can cause rebound acid and worsening symptoms.   So this is how we want you to get off the PPI:  - Start taking the nexium/protonix/prilosec/PPI  every other day with  zantac (ranitidine) 2 x a day for 2-4 weeks - some people stay on this dosage and can not taper off further. Our main goal is to limit the dosage and amount you are taking so if you need to stay on this dose.   - then decrease the PPI to every 3 days while taking the zantac (ranitidine) 300mg  twice a day the other  days for 2-4  Weeks  - then you can try the zantac (ranitidine) 300mg  once at night or up to 2 x day as needed.  - you can continue on this once at night or stop all together  - Avoid alcohol, spicy foods, NSAIDS (aleve, ibuprofen) at this time. See foods below.   +++++++++++++++++++++++++++++++++++++++++++  Food Choices for Gastroesophageal Reflux Disease  When you have gastroesophageal reflux disease (GERD), the foods you eat and your eating habits are very important. Choosing the right foods can help ease the discomfort of GERD. WHAT GENERAL GUIDELINES DO I NEED TO  FOLLOW?  Choose fruits, vegetables, whole grains, low-fat dairy products, and low-fat meat, fish, and poultry.  Limit fats such as oils, salad dressings, butter, nuts, and avocado.  Keep a food diary to identify foods that cause symptoms.  Avoid foods that cause reflux. These may be different for different people.  Eat frequent small meals instead of three large meals each day.  Eat your meals slowly, in a relaxed setting.  Limit fried foods.  Cook foods using methods other than frying.  Avoid drinking alcohol.  Avoid drinking large amounts of liquids with your meals.  Avoid bending over or lying down until 2-3 hours after eating.   WHAT FOODS ARE NOT RECOMMENDED? The following are some foods and drinks that may worsen your symptoms:  Vegetables Tomatoes. Tomato juice. Tomato and spaghetti sauce. Chili peppers. Onion and garlic. Horseradish. Fruits Oranges, grapefruit, and lemon (fruit and juice). Meats High-fat meats, fish, and poultry. This includes hot dogs, ribs, ham, sausage, salami, and bacon. Dairy Whole milk and chocolate milk. Sour cream. Cream. Butter. Ice cream. Cream cheese.  Beverages Coffee and tea, with or without caffeine. Carbonated beverages or energy drinks. Condiments Hot sauce. Barbecue sauce.  Sweets/Desserts Chocolate and cocoa. Donuts. Peppermint and spearmint. Fats and Oils High-fat foods,  including [place] fries and potato chips. Other Vinegar. Strong spices, such as black pepper, white pepper, red pepper, cayenne, curry powder, cloves, ginger, and chili powder. Nexium/protonix/prilosec are called PPI's, they are great at healing your stomach but should only be taken for a short period of time.

## 2016-07-11 NOTE — Progress Notes (Signed)
Complete Physical  Assessment and Plan:  Routine general medical examination at a health care facility  PFO (patent foramen ovale) Will monitor  Gastroesophageal reflux disease without esophagitis Continue PPI/H2 blocker, diet discussed  Eczema, unspecified type monitor  Medication management -     CBC with Differential/Platelet -     BASIC METABOLIC PANEL WITH GFR -     Hepatic function panel -     Magnesium  Vitamin D deficiency -     VITAMIN D 25 Hydroxy (Vit-D Deficiency, Fractures)  Need for diphtheria-tetanus-pertussis (Tdap) vaccine -     Tdap vaccine greater than or equal to 7yo IM  Class 1 obesity due to excess calories with serious comorbidity and body mass index (BMI) of 31.0 to 31.9 in adult - long discussion about weight loss, diet, and exercise  Screening cholesterol level -     Lipid panel  Screening for blood or protein in urine -     Urinalysis, Routine w reflex microscopic -     Microalbumin / creatinine urine ratio  Other fatigue - Discussed lifestyle modification as means of resolving problem, Training modifications discussed, See orders for lab evaluation, Discussed how depression can be a cause of fatigue if all labs are negative will discuss depression treatment.  -     TSH -     Iron and TIBC -     Ferritin -     Vitamin B12 -     Testosterone -     Follicle stimulating hormone -     Estradiol   Discussed med's effects and SE's. Screening labs and tests as requested with regular follow-up as recommended. Over 40 minutes of exam, counseling, chart review, and complex, high level critical decision making was performed this visit.   HPI  46 y.o. female  presents for a complete physical and follow up for has Overweight; SHORTNESS OF BREATH; Acid reflux; Dysphagia, unspecified(787.20); Eczema; and PFO (patent foramen ovale) on her problem list..  Her blood pressure has been controlled at home, today their BP is BP: 118/76 She does workout,  trying to with work and school. She denies chest pain, shortness of breath, dizziness.  Going to [employer] school, some increased stress, feels that she is struggling with weight loss. Feels that she is tried all the time, moody, wants hormones checked, have had a few hot flashes.  She will wake up 3 AM frequently and occ can not go back to sleep.  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  BMI is Body mass index is 31.47 kg/m., she is working on diet and exercise. Wt Readings from Last 3 Encounters:  07/11/16 195 lb (88.5 kg)  06/03/16 187 lb (84.8 kg)  10/12/15 179 lb (81.2 kg)    She is not on cholesterol medication and denies myalgias. Her cholesterol is at goal. The cholesterol last visit was:   Lab Results  Component Value Date   CHOL 150 07/22/2013   HDL 44 07/22/2013   LDLCALC 77 07/22/2013   TRIG 144 07/22/2013   CHOLHDL 3.4 07/22/2013   Patient is on Vitamin D supplement.   Lab Results  Component Value Date   VD25OH 35 12/17/2014      Current Medications:  Current Outpatient Prescriptions on File Prior to Visit  Medication Sig Dispense Refill  . fexofenadine (ALLEGRA) 180 MG tablet Take 180 mg by mouth daily.    Marland Kitchen ibuprofen (ADVIL,MOTRIN) 200 MG  tablet Take 200 mg by mouth every 6 (six) hours as needed for mild pain.     No current facility-administered medications on file prior to visit.    Allergies:  No Known Allergies Medical History:  She has Overweight; SHORTNESS OF BREATH; Acid reflux; Dysphagia, unspecified(787.20); Eczema; and PFO (patent foramen ovale) on her problem list. Health Maintenance:   Immunization History  Administered Date(s) Administered  . Influenza Split 01/31/2011  . Tdap 07/11/2016    Tetanus: 2018 Pneumovax: N/A Prevnar 13: N/A Flu vaccine: declines Zostavax: N/A LMP: s/p hysterectomy Pap: s/p hysterectomy MGM: 05/2016  DEXA:N/A Colonoscopy: N/A EGD: N/A  Last Dental  Exam: Dr. Nicholaus Bloom Last Eye Exam: [facility] eye doctor [place] [address].  Patient Care Team: Vicie Mutters, PA-C as PCP - General (Physician Assistant)  Surgical History:  She has a past surgical history that includes Cesarean section; Endometrial ablation (04/2005); Colposcopy; Dental surgery; doppler echocardiography; Upper gi endoscopy; Dilatation & curettage/hysteroscopy with myosure (N/A, 07/07/2015); Laparoscopic vaginal hysterectomy with salpingectomy (Bilateral, 09/15/2015); and Cystoscopy (09/15/2015). Family History:  Herfamily history includes Diabetes in her paternal grandmother; Heart disease in her paternal grandmother; Hyperlipidemia in her mother; Hypertension in her mother and paternal grandmother. Social History:  She reports that she quit smoking about 11 years ago. Her smoking use included Cigarettes. She smoked 1.50 packs per day. She has never used smokeless tobacco. She reports that she drinks alcohol. She reports that she does not use drugs.  Review of Systems: Review of Systems  Constitutional: Positive for malaise/fatigue. Negative for chills, diaphoresis, fever and weight loss.  HENT: Negative.   Eyes: Negative.   Respiratory: Negative.   Cardiovascular: Negative.   Gastrointestinal: Positive for heartburn. Negative for abdominal pain, blood in stool, constipation, diarrhea, melena, nausea and vomiting.  Genitourinary: Negative.        Vaginal dryness  Musculoskeletal: Negative.   Skin: Negative.   Neurological: Negative for weakness.    Physical Exam: Estimated body mass index is 31.47 kg/m as calculated from the following:   Height as of this encounter: 5\' 6"  (1.676 m).   Weight as of this encounter: 195 lb (88.5 kg). BP 118/76   Pulse 74   Temp 97.7 F (36.5 C)   Resp 14   Ht 5\' 6"  (1.676 m)   Wt 195 lb (88.5 kg)   SpO2 96%   BMI 31.47 kg/m  General Appearance: Well nourished, in no apparent distress.  Eyes: PERRLA, EOMs, conjunctiva no swelling or erythema,  normal fundi and vessels.  Sinuses: No Frontal/maxillary tenderness  ENT/Mouth: Ext aud canals clear, normal light reflex with TMs without erythema, bulging. Good dentition. No erythema, swelling, or exudate on post pharynx. Tonsils not swollen or erythematous. Hearing normal.  Neck: Supple, thyroid normal. No bruits  Respiratory: Respiratory effort normal, BS equal bilaterally without rales, rhonchi, wheezing or stridor.  Cardio: RRR without murmurs, rubs or gallops. Brisk peripheral pulses without edema.  Chest: symmetric, with normal excursions and percussion.  Breasts: defer Abdomen: Soft, nontender, no guarding, rebound, hernias, masses, or organomegaly.  Lymphatics: Non tender without lymphadenopathy.  Genitourinary: defer Musculoskeletal: Full ROM all peripheral extremities,5/5 strength, and normal gait.  Skin: Warm, dry without rashes, lesions, ecchymosis. Neuro: Cranial nerves intact, reflexes equal bilaterally. Normal muscle tone, no cerebellar symptoms. Sensation intact.  Psych: Awake and oriented X 3, normal affect, Insight and Judgment appropriate.   EKG: declines AORTA SCAN: declines   Vicie Mutters 2:14 PM [facility]

## 2016-07-12 LAB — URINALYSIS, ROUTINE W REFLEX MICROSCOPIC
Bilirubin Urine: NEGATIVE
Glucose, UA: NEGATIVE
Hgb urine dipstick: NEGATIVE
Ketones, ur: NEGATIVE
LEUKOCYTES UA: NEGATIVE
NITRITE: NEGATIVE
PH: 6 (ref 5.0–8.0)
Protein, ur: NEGATIVE
SPECIFIC GRAVITY, URINE: 1.009 (ref 1.001–1.035)

## 2016-07-12 LAB — MICROALBUMIN / CREATININE URINE RATIO: Creatinine, Urine: 53 mg/dL (ref 20–320)

## 2016-07-12 LAB — TESTOSTERONE: Testosterone: 30 ng/dL

## 2016-07-12 LAB — ESTRADIOL: Estradiol: 79 pg/mL

## 2016-07-12 LAB — VITAMIN B12: Vitamin B-12: 481 pg/mL (ref 200–1100)

## 2016-07-12 LAB — TSH: TSH: 1.43 mIU/L

## 2016-07-12 LAB — FOLLICLE STIMULATING HORMONE: FSH: 20.2 m[IU]/mL

## 2016-07-12 LAB — FERRITIN: Ferritin: 44 ng/mL (ref 10–232)

## 2016-07-12 LAB — MAGNESIUM: Magnesium: 2.1 mg/dL (ref 1.5–2.5)

## 2016-07-12 LAB — VITAMIN D 25 HYDROXY (VIT D DEFICIENCY, FRACTURES): Vit D, 25-Hydroxy: 33 ng/mL (ref 30–100)

## 2016-07-21 ENCOUNTER — Encounter: Payer: Self-pay | Admitting: Gynecology

## 2016-11-03 ENCOUNTER — Encounter (INDEPENDENT_AMBULATORY_CARE_PROVIDER_SITE_OTHER): Payer: Self-pay | Admitting: Orthopedic Surgery

## 2016-11-03 ENCOUNTER — Ambulatory Visit (INDEPENDENT_AMBULATORY_CARE_PROVIDER_SITE_OTHER): Payer: 59

## 2016-11-03 ENCOUNTER — Other Ambulatory Visit (INDEPENDENT_AMBULATORY_CARE_PROVIDER_SITE_OTHER): Payer: Self-pay

## 2016-11-03 ENCOUNTER — Ambulatory Visit (INDEPENDENT_AMBULATORY_CARE_PROVIDER_SITE_OTHER): Payer: 59 | Admitting: Orthopedic Surgery

## 2016-11-03 VITALS — BP 134/83 | HR 71 | Resp 14 | Ht 66.0 in | Wt 187.0 lb

## 2016-11-03 DIAGNOSIS — M2241 Chondromalacia patellae, right knee: Secondary | ICD-10-CM

## 2016-11-03 DIAGNOSIS — M25561 Pain in right knee: Secondary | ICD-10-CM

## 2016-11-03 DIAGNOSIS — G8929 Other chronic pain: Secondary | ICD-10-CM

## 2016-11-03 MED ORDER — METHYLPREDNISOLONE ACETATE 40 MG/ML IJ SUSP
80.0000 mg | INTRAMUSCULAR | Status: AC | PRN
Start: 2016-11-03 — End: 2016-11-03
  Administered 2016-11-03: 80 mg

## 2016-11-03 MED ORDER — LIDOCAINE HCL 1 % IJ SOLN
3.0000 mL | INTRAMUSCULAR | Status: AC | PRN
  Administered 2016-11-03: 3 mL

## 2016-11-03 MED ORDER — BUPIVACAINE HCL 0.5 % IJ SOLN
3.0000 mL | INTRAMUSCULAR | Status: AC | PRN
  Administered 2016-11-03: 3 mL via INTRA_ARTICULAR

## 2016-11-03 NOTE — Progress Notes (Signed)
Office Visit Note   Patient: Kathryn Larsen           Date of Birth: 1971-02-27           MRN: [id number] Visit Date: 11/03/2016              Requested by: Vicie Mutters, PA-C [id number] [place], [place] [address] PCP: Vicie Mutters, PA-C   Assessment & Plan: Visit Diagnoses:  1. Chondromalacia patellae, right knee   2. Chronic pain of right knee     Plan:  #1: Corticosteroid injection to the right knee was given without difficulty #2: If this is not successful then she should consider calling and obtaining an MRI scan to look for other pathology.  Follow-Up Instructions: Return if symptoms worsen or fail to improve.   Orders:  Orders Placed This Encounter  Procedures  . Large Joint Injection/Arthrocentesis  . XR Knee Complete 4 Views Right   No orders of the defined types were placed in this encounter.     Procedures: Large Joint Inj Date/Time: 11/03/2016 9:57 AM Performed by: Biagio Borg D Authorized by: Biagio Borg D   Consent Given by:  Patient Timeout: prior to procedure the correct patient, procedure, and site was verified   Indications:  Pain and joint swelling Location:  Knee Site:  R knee Prep: patient was prepped and draped in usual sterile fashion   Needle Size:  25 G Needle Length:  1.5 inches Approach:  Anteromedial Ultrasound Guidance: No   Fluoroscopic Guidance: No   Arthrogram: No   Medications:  3 mL bupivacaine 0.5 %; 3 mL lidocaine 1 %; 80 mg methylPREDNISolone acetate 40 MG/ML Aspiration Attempted: No   Patient tolerance:  Patient tolerated the procedure well with no immediate complications       Clinical Data: No additional findings.   Subjective: Chief Complaint  Patient presents with  . Right Knee - Pain  . Knee Pain    Pain x 04/2016, running stepping into curb, off/on, pain w/twisting, gives way, grinding, popping at times, swelling at time of injury - no swelling presently, diffculty walking  at times, not constant, not diabetic, no surgery, IBU helps    Kathryn Larsen is a 46 year old white married female who presents with pain in the parapatellar area of the right knee. She states back in January 2018 she was running and stepped into a curb and started having pain in the right knee. She does have occasional swelling. She does have symptoms of pain when she twists the knee. It does occasionally give way and has some grinding and popping at times. Her swelling was only at the time of her injury. Effusions now. She does have difficulty walking at times but this is not a constant symptom. Ibuprofen has been somewhat helpful but since this been going on so long she decided to have this evaluated.         Review of Systems  Constitutional: Negative.   HENT: Negative.   Respiratory: Negative.   Cardiovascular: Negative.   Gastrointestinal: Negative.   Genitourinary: Negative.   Skin: Negative.   Neurological: Negative.   Hematological: Negative.   Psychiatric/Behavioral: Negative.      Objective: Vital Signs: BP 134/83 (BP Location: Right Arm, Patient Position: Sitting, Cuff Size: Normal)   Pulse 71   Resp 14   Ht 5\' 6"  (1.676 m)   Wt 187 lb (84.8 kg)   BMI 30.18 kg/m   Physical Exam  Constitutional: She is oriented  to person, place, and time. She appears well-developed and well-nourished.  HENT:  Head: Normocephalic and atraumatic.  Eyes: Pupils are equal, round, and reactive to light. EOM are normal.  Pulmonary/Chest: Effort normal.  Neurological: She is alert and oriented to person, place, and time.  Skin: Skin is warm and dry.  Psychiatric: She has a normal mood and affect. Her behavior is normal. Judgment and thought content normal.    Ortho Exam  Range of motion 0-120. She does have some patellofemoral crepitance with flexion. Trace effusion. Little bit of tenderness over the medial joint line. Ligamentously stable. No pain with internal or external rotation of the  hip  Specialty Comments:  No specialty comments available.  Imaging: Xr Knee Complete 4 Views Right  Result Date: 11/03/2016 4 view x-ray of the right knee reveals essentially normal. She may have a little bit of sclerosing at the medial tibial plateau. But she has maintained good joint space.    PMFS History: Patient Active Problem List   Diagnosis Date Noted  . PFO (patent foramen ovale) 12/17/2014  . Eczema   . Dysphagia, unspecified(787.20) 04/25/2011  . Acid reflux   . Overweight 02/05/2009  . SHORTNESS OF BREATH 02/05/2009   Past Medical History:  Diagnosis Date  . Chronic headaches   . Eczema   . GERD (gastroesophageal reflux disease)   . 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 Hyperlipemia   . LGSIL (low grade squamous intraepithelial dysplasia) 07/2013, 05/2015   Follow up colposcopy adequate/normal  ECC negative 2015  . PFO (patent foramen ovale)    Patient states she has no symptoms, this was diagnosis on echocardiogram  . Restless leg     Family History  Problem Relation Age of Onset  . Hypertension Mother   . Hyperlipidemia Mother   . Myopathy Mother        statin induced myopathy  . Diabetes Paternal Grandmother   . Hypertension Paternal Grandmother   . Heart disease Paternal Grandmother   . COPD Father   . Colon cancer Neg Hx     Past Surgical History:  Procedure Laterality Date  . CESAREAN SECTION    . COLPOSCOPY    . CYSTOSCOPY  09/15/2015   Procedure: CYSTOSCOPY;  Surgeon: [name], MD;  Location: [facility];  Service: Gynecology;;  . DENTAL SURGERY    . DILATATION & CURETTAGE/HYSTEROSCOPY WITH MYOSURE N/A 07/07/2015   Procedure: DILATATION & CURETTAGE  attempted HYSTEROSCOPY ;  Surgeon: [name], MD;  Location: [facility];  Service: Gynecology;  Laterality: N/A;  . DOPPLER ECHOCARDIOGRAPHY     with bubble  . ENDOMETRIAL ABLATION  04/2005   NOVASURE  . LAPAROSCOPIC VAGINAL HYSTERECTOMY WITH SALPINGECTOMY Bilateral 09/15/2015   Procedure:  LAPAROSCOPIC ASSISTED VAGINAL HYSTERECTOMY WITH SALPINGECTOMY;  Surgeon: [name], MD;  Location: [facility];  Service: Gynecology;  Laterality: Bilateral;  . UPPER GI ENDOSCOPY     Social History   Occupational History  . RN [employer]   Social History Main Topics  . Smoking status: Former Smoker    Packs/day: 1.50    Years: 20.00    Types: Cigarettes    Quit date: 06/17/2005  . Smokeless tobacco: Never Used  . Alcohol use 0.6 oz/week    1 Cans of beer per week  . Drug use: No  . Sexual activity: Yes    Birth control/ protection: Other-see comments     Comment:  Vasectomy-1st intercourse 46 yo-More than 5 partners

## 2016-11-27 ENCOUNTER — Encounter (INDEPENDENT_AMBULATORY_CARE_PROVIDER_SITE_OTHER): Payer: Self-pay | Admitting: Orthopedic Surgery

## 2016-11-28 ENCOUNTER — Other Ambulatory Visit (INDEPENDENT_AMBULATORY_CARE_PROVIDER_SITE_OTHER): Payer: Self-pay

## 2016-11-28 DIAGNOSIS — G8929 Other chronic pain: Secondary | ICD-10-CM

## 2016-11-28 DIAGNOSIS — M25561 Pain in right knee: Principal | ICD-10-CM

## 2016-12-14 ENCOUNTER — Ambulatory Visit
Admission: RE | Admit: 2016-12-14 | Discharge: 2016-12-14 | Disposition: A | Discharge status: Home / Self Care | Payer: 59 | Source: Ambulatory Visit | Attending: Orthopaedic Surgery | Admitting: Orthopaedic Surgery

## 2016-12-14 DIAGNOSIS — G8929 Other chronic pain: Secondary | ICD-10-CM

## 2016-12-14 DIAGNOSIS — M25561 Pain in right knee: Principal | ICD-10-CM

## 2016-12-22 ENCOUNTER — Ambulatory Visit: Payer: Self-pay

## 2016-12-29 ENCOUNTER — Encounter: Payer: Self-pay | Admitting: Physician Assistant

## 2016-12-29 ENCOUNTER — Ambulatory Visit (INDEPENDENT_AMBULATORY_CARE_PROVIDER_SITE_OTHER): Payer: 59 | Admitting: Physician Assistant

## 2016-12-29 VITALS — BP 118/80 | HR 83 | Temp 97.2°F | Resp 16 | Ht 67.0 in | Wt 191.8 lb

## 2016-12-29 DIAGNOSIS — Z111 Encounter for screening for respiratory tuberculosis: Secondary | ICD-10-CM | POA: Diagnosis not present

## 2016-12-29 DIAGNOSIS — Z020 Encounter for examination for admission to educational institution: Secondary | ICD-10-CM

## 2016-12-29 DIAGNOSIS — Z23 Encounter for immunization: Secondary | ICD-10-CM

## 2016-12-29 NOTE — Progress Notes (Signed)
   Subjective:    Patient ID: Kathryn Larsen, female    DOB: [birth year], 46 y.o.   MRN: [id number]  HPI  46 y.o. 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   Medications Current Outpatient Prescriptions on File Prior to Visit  Medication Sig  . aspirin EC 81 MG tablet Take 81 mg by mouth daily.  . Cetirizine-Pseudoephedrine (ZYRTEC-D PO) Take by mouth daily.  . Cholecalciferol (VITAMIN D PO) Take by mouth daily.  . Cyanocobalamin (B-12 SL) Place under the tongue.  . fexofenadine (ALLEGRA) 180 MG tablet Take 180 mg by mouth daily.  Marland Kitchen ibuprofen (ADVIL,MOTRIN) 200 MG tablet Take 200 mg by mouth every 6 (six) hours as needed for mild pain.  . Red Yeast Rice Extract (RED YEAST RICE PO) Take by mouth 2 (two) times daily.   No current facility-administered medications on file prior to visit.     Problem list She has Overweight; SHORTNESS OF BREATH; Acid reflux; Dysphagia, unspecified(787.20); Eczema; and PFO (patent foramen ovale) on her problem list.  Immunization History  Administered Date(s) Administered  . Influenza Inj Mdck Quad With Preservative 12/29/2016  . Influenza Split 01/31/2011  . Tdap 07/11/2016     Review of Systems  Constitutional: Negative.   HENT: Negative.   Respiratory: Negative.   Cardiovascular: Negative.   Gastrointestinal: Negative.   Genitourinary: Negative.   Musculoskeletal: Negative.   Skin: Negative.   Neurological: Negative.   Hematological: Negative.   Psychiatric/Behavioral: Negative.        Objective:   Physical Exam  Constitutional: She is oriented to person, place, and time. She appears well-developed and well-nourished.  HENT:  Head: Normocephalic and atraumatic.  Right Ear: External ear normal.  Left Ear: External ear normal.  Mouth/Throat: Oropharynx is clear and moist.  Eyes: Pupils are equal, round, and reactive to light. Conjunctivae and EOM are normal.  Neck: Normal range of motion. Neck supple.  No thyromegaly present.  Cardiovascular: Normal rate, regular rhythm and normal heart sounds.  Exam reveals no gallop and no friction rub.   No murmur heard. Pulmonary/Chest: Effort normal and breath sounds normal. No respiratory distress. She has no wheezes.  Abdominal: Soft. Bowel sounds are normal. She exhibits no distension and no mass. There is no tenderness. There is no rebound and no guarding.  Musculoskeletal: Normal range of motion.  Lymphadenopathy:    She has no cervical adenopathy.  Neurological: She is alert and oriented to person, place, and time. She displays normal reflexes. No cranial nerve deficit. Coordination normal.  Skin: Skin is warm and dry.  Psychiatric: She has a normal mood and affect.       Assessment & Plan:   PPD screening test -     TB Skin Test  Needs flu shot -     FLU VACCINE MDCK QUAD W/Preservative  Encounter for school examination

## 2017-01-02 ENCOUNTER — Encounter: Payer: Self-pay | Admitting: Physician Assistant

## 2017-01-02 DIAGNOSIS — R760 Raised antibody titer: Secondary | ICD-10-CM

## 2017-01-08 ENCOUNTER — Encounter: Payer: Self-pay | Admitting: Physician Assistant

## 2017-01-09 ENCOUNTER — Encounter (INDEPENDENT_AMBULATORY_CARE_PROVIDER_SITE_OTHER): Payer: Self-pay | Admitting: Orthopaedic Surgery

## 2017-01-09 ENCOUNTER — Ambulatory Visit (INDEPENDENT_AMBULATORY_CARE_PROVIDER_SITE_OTHER): Payer: 59 | Admitting: Orthopaedic Surgery

## 2017-01-09 ENCOUNTER — Other Ambulatory Visit: Payer: Self-pay

## 2017-01-09 ENCOUNTER — Other Ambulatory Visit: Payer: 59

## 2017-01-09 VITALS — BP 105/73 | HR 66 | Resp 14 | Ht 66.0 in | Wt 191.0 lb

## 2017-01-09 DIAGNOSIS — R760 Raised antibody titer: Secondary | ICD-10-CM

## 2017-01-09 DIAGNOSIS — Z79899 Other long term (current) drug therapy: Secondary | ICD-10-CM

## 2017-01-09 DIAGNOSIS — G8929 Other chronic pain: Secondary | ICD-10-CM | POA: Diagnosis not present

## 2017-01-09 DIAGNOSIS — M25561 Pain in right knee: Secondary | ICD-10-CM

## 2017-01-09 NOTE — Progress Notes (Signed)
Office Visit Note   Patient: Kathryn Larsen           Date of Birth: 12-24-1970           MRN: [id number] Visit Date: 01/09/2017              Requested by: Vicie Mutters, PA-C [address] [place], [place] [address] PCP: Vicie Mutters, PA-C   Assessment & Plan: Visit Diagnoses:  1. Chronic pain of right knee   Chondromalacia patella  Plan: Discussed results of MRI scan. Work on quadriceps strengthening excises. NSAIDs as necessary  Follow-Up Instructions: Return if symptoms worsen or fail to improve.   Orders:  No orders of the defined types were placed in this encounter.  No orders of the defined types were placed in this encounter.     Procedures: No procedures performed   Clinical Data: No additional findings.   Subjective: Chief Complaint  Patient presents with  . Right Knee - Results    Kathryn Larsen is a 46 y o that is here for results from MRI Right knee  MRI scan demonstrated intact anterior cruciate ligament PCL, MCL and LCL. Both menisci are intact. Some mild chondral thinning of the patella apex. Medial lateral compartments were also intact without evidence of chondromalacia. She is asymptomatic.  HPI  Review of Systems  Constitutional: Negative for chills, fatigue and fever.  Eyes: Negative for itching.  Respiratory: Negative for chest tightness and shortness of breath.   Cardiovascular: Negative for chest pain, palpitations and leg swelling.  Gastrointestinal: Negative for blood in stool, constipation and diarrhea.  Endocrine: Negative for polyuria.  Genitourinary: Negative for dysuria.  Musculoskeletal: Positive for back pain. Negative for joint swelling, neck pain and neck stiffness.  Allergic/Immunologic: Negative for immunocompromised state.  Neurological: Negative for dizziness and numbness.  Hematological: Does not bruise/bleed easily.  Psychiatric/Behavioral: The patient is not nervous/anxious.       Objective: Vital Signs: BP 105/73   Pulse 66   Resp 14   Ht 5\' 6"  (1.676 m)   Wt 191 lb (86.6 kg)   BMI 30.83 kg/m   Physical Exam  Ortho Exam right knee without effusion. Mild patellar crepitation. No pain along the medial lateral parapatellar region. No joint pain. No instability no opening with varus and valgus stress. No calf pain. No popliteal mass. Neurovascular exam intact distally. No limp. Straight leg raise negative  Specialty Comments:  No specialty comments available.  Imaging: No results found.   PMFS History: Patient Active Problem List   Diagnosis Date Noted  . PFO (patent foramen ovale) 12/17/2014  . Eczema   . Dysphagia, unspecified(787.20) 04/25/2011  . Acid reflux   . Overweight 02/05/2009  . SHORTNESS OF BREATH 02/05/2009   Past Medical History:  Diagnosis Date  . Chronic headaches   . Eczema   . GERD (gastroesophageal reflux disease)   . 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 Hyperlipemia   . LGSIL (low grade squamous intraepithelial dysplasia) 07/2013, 05/2015   Follow up colposcopy adequate/normal  ECC negative 2015  . PFO (patent foramen ovale)    Patient states she has no symptoms, this was diagnosis on echocardiogram  . Restless leg     Family History  Problem Relation Age of Onset  . Hypertension Mother   . Hyperlipidemia Mother   . Myopathy Mother        statin induced myopathy  . Diabetes Paternal Grandmother   . Hypertension Paternal Grandmother   .  Heart disease Paternal Grandmother   . COPD Father   . Colon cancer Neg Hx     Past Surgical History:  Procedure Laterality Date  . CESAREAN SECTION    . COLPOSCOPY    . CYSTOSCOPY  09/15/2015   Procedure: CYSTOSCOPY;  Surgeon: [name], MD;  Location: [facility];  Service: Gynecology;;  . DENTAL SURGERY    . DILATATION & CURETTAGE/HYSTEROSCOPY WITH MYOSURE N/A 07/07/2015   Procedure: DILATATION & CURETTAGE  attempted HYSTEROSCOPY ;  Surgeon: [name], MD;  Location:  [facility];  Service: Gynecology;  Laterality: N/A;  . DOPPLER ECHOCARDIOGRAPHY     with bubble  . ENDOMETRIAL ABLATION  04/2005   NOVASURE  . LAPAROSCOPIC VAGINAL HYSTERECTOMY WITH SALPINGECTOMY Bilateral 09/15/2015   Procedure: LAPAROSCOPIC ASSISTED VAGINAL HYSTERECTOMY WITH SALPINGECTOMY;  Surgeon: [name], MD;  Location: [facility];  Service: Gynecology;  Laterality: Bilateral;  . UPPER GI ENDOSCOPY     Social History   Occupational History  . RN [employer]   Social History Main Topics  . Smoking status: Former Smoker    Packs/day: 1.50    Years: 20.00    Types: Cigarettes    Quit date: 06/17/2005  . Smokeless tobacco: Never Used  . Alcohol use 0.6 oz/week    1 Cans of beer per week  . Drug use: No  . Sexual activity: Yes    Birth control/ protection: Other-see comments     Comment:  Vasectomy-1st intercourse 46 yo-More than 5 partners

## 2017-01-10 LAB — HEPATITIS B SURFACE ANTIBODY,QUALITATIVE: HEP B S AB: REACTIVE — AB

## 2017-01-10 LAB — VARICELLA ZOSTER ANTIBODY, IGG: VARICELLA IGG: 1536 {index}

## 2017-03-30 ENCOUNTER — Encounter: Payer: Self-pay | Admitting: Physician Assistant

## 2017-03-30 MED ORDER — PREDNISONE 20 MG PO TABS: ORAL_TABLET | ORAL | 0 refills | Status: DC

## 2017-03-30 MED ORDER — AZITHROMYCIN 250 MG PO TABS: ORAL_TABLET | ORAL | 1 refills | Status: AC

## 2017-06-05 ENCOUNTER — Encounter: Payer: 59 | Admitting: Gynecology

## 2017-07-11 ENCOUNTER — Other Ambulatory Visit: Payer: Self-pay | Admitting: Gynecology

## 2017-07-11 DIAGNOSIS — Z139 Encounter for screening, unspecified: Secondary | ICD-10-CM

## 2017-07-11 NOTE — Progress Notes (Signed)
Complete Physical  Assessment and Plan:  Routine general medical examination at a health care facility  PFO (patent foramen ovale) Will monitor  Gastroesophageal reflux disease without esophagitis Continue PPI/H2 blocker, diet discussed  Eczema, unspecified type monitor  Medication management -     CBC with Differential/Platelet -     BASIC METABOLIC PANEL WITH GFR -     Hepatic function panel -     Magnesium  Vitamin D deficiency -     VITAMIN D 25 Hydroxy (Vit-D Deficiency, Fractures)  Need for diphtheria-tetanus-pertussis (Tdap) vaccine -     Tdap vaccine greater than or equal to 7yo IM  Class 1 obesity due to excess calories with serious comorbidity and body mass index (BMI) of 31.0 to 31.9 in adult - long discussion about weight loss, diet, and exercise  Screening cholesterol level -     Lipid panel  Screening for blood or protein in urine -     Urinalysis, Routine w reflex microscopic -     Microalbumin / creatinine urine ratio   Discussed med's effects and SE's. Screening labs and tests as requested with regular follow-up as recommended. Over 40 minutes of exam, counseling, chart review, and complex, high level critical decision making was performed this visit.   HPI  47 y.o. female  presents for a complete physical and follow up for has Acid reflux; Eczema; and PFO (patent foramen ovale) on their problem list..  Her blood pressure has been controlled at home, today their BP is BP: 124/66 She does workout, trying to with work and school. She denies chest pain, shortness of breath, dizziness.  Going to [employer], some increased stress, feels that she is struggling with weight loss. Took a break and has started clinicals. She still has some hot flashes, some night or day flashes.    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  BMI is Body mass index is 31.19 kg/m., she is working on diet and  exercise. Wt Readings from Last 3 Encounters:  07/12/17 196 lb 3.2 oz (89 kg)  01/09/17 191 lb (86.6 kg)  12/29/16 191 lb 12.8 oz (87 kg)    She is not on cholesterol medication and denies myalgias. Her cholesterol is at goal. The cholesterol last visit was:   Lab Results  Component Value Date   CHOL 251 (H) 07/11/2016   HDL 64 07/11/2016   LDLCALC 165 (H) 07/11/2016   TRIG 109 07/11/2016   CHOLHDL 3.9 07/11/2016   Patient is on Vitamin D supplement.   Lab Results  Component Value Date   VD25OH 33 07/11/2016     BMI is Body mass index is 31.19 kg/m., she is working on diet and exercise. Wt Readings from Last 3 Encounters:  07/12/17 196 lb 3.2 oz (89 kg)  01/09/17 191 lb (86.6 kg)  12/29/16 191 lb 12.8 oz (87 kg)   Current Medications:  Current Outpatient Medications on File Prior to Visit  Medication Sig Dispense Refill  . aspirin EC 81 MG tablet Take 81 mg by mouth daily.    . Cetirizine-Pseudoephedrine (ZYRTEC-D PO) Take by mouth daily.    . Cholecalciferol (VITAMIN D PO) Take by mouth daily.    . Cyanocobalamin (B-12 SL) Place under the tongue.    . fexofenadine (ALLEGRA) 180 MG tablet Take 180 mg by mouth daily.    Marland Kitchen ibuprofen (ADVIL,MOTRIN) 200 MG tablet Take 200 mg by mouth every  6 (six) hours as needed for mild pain.    . Red Yeast Rice Extract (RED YEAST RICE PO) Take by mouth 2 (two) times daily.     No current facility-administered medications on file prior to visit.    Allergies:  No Known Allergies   Medical History:  She has Acid reflux; Eczema; and PFO (patent foramen ovale) on their problem list.   Health Maintenance:   Immunization History  Administered Date(s) Administered  . Influenza Inj Mdck Quad With Preservative 12/29/2016  . Influenza Split 01/31/2011  . PPD Test 12/29/2016  . Tdap 07/11/2016    Tetanus: 2018 Pneumovax: N/A Prevnar 13: N/A Flu vaccine: 2018 Zostavax: N/A  LMP: s/p hysterectomy Pap: 2018- had high risk HPV until last  year MGM: 05/2016 Has OV on the 23rd DEXA:N/A Colonoscopy: N/A EGD: N/A  Last Dental Exam: Dr. ziggler Last Eye Exam: Dr. Katy Fitch- going to see him, has OV next week. .  Patient Care Team: Vicie Mutters, PA-C as PCP - General (Physician Assistant)  Surgical History:  She has a past surgical history that includes Cesarean section; Endometrial ablation (04/2005); Colposcopy; Dental surgery; doppler echocardiography; Upper gi endoscopy; Dilatation & curettage/hysteroscopy with myosure (N/A, 07/07/2015); Laparoscopic vaginal hysterectomy with salpingectomy (Bilateral, 09/15/2015); and Cystoscopy (09/15/2015). Family History:  Herfamily history includes COPD in her father; Diabetes in her paternal grandmother; Heart disease in her paternal grandmother; Hyperlipidemia in her mother; Hypertension in her mother and paternal grandmother; Myopathy in her mother. Social History:  She reports that she quit smoking about 12 years ago. Her smoking use included cigarettes. She has a 30.00 pack-year smoking history. She has never used smokeless tobacco. She reports that she drinks about 0.6 oz of alcohol per week. She reports that she does not use drugs.  Review of Systems: Review of Systems  Constitutional: Positive for malaise/fatigue. Negative for chills, diaphoresis, fever and weight loss.  HENT: Negative.   Eyes: Negative.   Respiratory: Negative.   Cardiovascular: Negative.   Gastrointestinal: Positive for heartburn. Negative for abdominal pain, blood in stool, constipation, diarrhea, melena, nausea and vomiting.  Genitourinary: Negative.        Vaginal dryness  Musculoskeletal: Negative.   Skin: Negative.   Neurological: Negative for weakness.    Physical Exam: Estimated body mass index is 31.19 kg/m as calculated from the following:   Height as of this encounter: 5' 6.5" (1.689 m).   Weight as of this encounter: 196 lb 3.2 oz (89 kg). BP 124/66   Pulse 81   Temp (!) 97.5 F (36.4 C)    Resp 16   Ht 5' 6.5" (1.689 m)   Wt 196 lb 3.2 oz (89 kg)   SpO2 95%   BMI 31.19 kg/m  General Appearance: Well nourished, in no apparent distress.  Eyes: PERRLA, EOMs, conjunctiva no swelling or erythema, normal fundi and vessels.  Sinuses: No Frontal/maxillary tenderness  ENT/Mouth: Ext aud canals clear, normal light reflex with TMs without erythema, bulging. Good dentition. No erythema, swelling, or exudate on post pharynx. Tonsils not swollen or erythematous. Hearing normal.  Neck: Supple, thyroid normal. No bruits  Respiratory: Respiratory effort normal, BS equal bilaterally without rales, rhonchi, wheezing or stridor.  Cardio: RRR without murmurs, rubs or gallops. Brisk peripheral pulses without edema.  Chest: symmetric, with normal excursions and percussion.  Breasts: defer Abdomen: Soft, nontender, no guarding, rebound, hernias, masses, or organomegaly.  Lymphatics: Non tender without lymphadenopathy.  Genitourinary: defer Musculoskeletal: Full ROM all peripheral extremities,5/5 strength, and normal  gait.  Skin: Warm, dry without rashes, lesions, ecchymosis. Neuro: Cranial nerves intact, reflexes equal bilaterally. Normal muscle tone, no cerebellar symptoms. Sensation intact.  Psych: Awake and oriented X 3, normal affect, Insight and Judgment appropriate.   EKG: declines AORTA SCAN: declines   Vicie Mutters 2:18 PM [facility]

## 2017-07-12 ENCOUNTER — Ambulatory Visit (INDEPENDENT_AMBULATORY_CARE_PROVIDER_SITE_OTHER): Payer: 59 | Admitting: Physician Assistant

## 2017-07-12 ENCOUNTER — Encounter: Payer: Self-pay | Admitting: Physician Assistant

## 2017-07-12 VITALS — BP 124/66 | HR 81 | Temp 97.5°F | Resp 16 | Ht 66.5 in | Wt 196.2 lb

## 2017-07-12 DIAGNOSIS — Z1389 Encounter for screening for other disorder: Secondary | ICD-10-CM

## 2017-07-12 DIAGNOSIS — Q211 Atrial septal defect: Secondary | ICD-10-CM

## 2017-07-12 DIAGNOSIS — E782 Mixed hyperlipidemia: Secondary | ICD-10-CM

## 2017-07-12 DIAGNOSIS — Q2112 Patent foramen ovale: Secondary | ICD-10-CM

## 2017-07-12 DIAGNOSIS — E559 Vitamin D deficiency, unspecified: Secondary | ICD-10-CM

## 2017-07-12 DIAGNOSIS — Z79899 Other long term (current) drug therapy: Secondary | ICD-10-CM

## 2017-07-12 DIAGNOSIS — Z6831 Body mass index (BMI) 31.0-31.9, adult: Secondary | ICD-10-CM

## 2017-07-12 DIAGNOSIS — L309 Dermatitis, unspecified: Secondary | ICD-10-CM

## 2017-07-12 DIAGNOSIS — Z1329 Encounter for screening for other suspected endocrine disorder: Secondary | ICD-10-CM

## 2017-07-12 DIAGNOSIS — Z0000 Encounter for general adult medical examination without abnormal findings: Secondary | ICD-10-CM | POA: Diagnosis not present

## 2017-07-12 DIAGNOSIS — K219 Gastro-esophageal reflux disease without esophagitis: Secondary | ICD-10-CM

## 2017-07-12 NOTE — Patient Instructions (Addendum)
Intermittent fasting is more about strategy than starvation. It's meant to reset your body in different ways, hopefully with fitness and nutrition changes as a result.  Like any big switchover, though, results may vary when it comes down to the individual level. What works for your friends may not work for you, or vice versa. That's why it's helpful to play around with variations on intermittent fasting and healthy habits and find what works best for you.  WHAT IS INTERMITTENT FASTING AND WHY DO IT?  Intermittent fasting doesn't involve specific foods, but rather, a strict schedule regarding when you eat. Also called "time-restricted eating," the tactic has been praised for its contribution to weight loss, improved body composition, and decreased cravings. Preliminary research also suggests it may be beneficial for glucose tolerance, hormone regulation, better muscle mass and lower body fat.  Part of its appeal is the simplicity of the effort. Unlike some other trends, there's no calculations to intermittent fasting.  You simply eat within a certain block of time, usually a window of 8-10 hours. In the other big block of time - about 14-16 hours, including when you're asleep - you don't eat anything, not even snacks. You can drink water, coffee, tea or any other beverage that doesn't have calories.  For example, if you like having a late dinner, you might skip breakfast and have your first meal at noon and your last meal of the day at 8 p.m., and then not eat until noon again the next day.  IDEAS FOR GETTING STARTED  If you're new to the strategy, it may be helpful to eat within the typical circadian rhythm and keep eating within daylight hours. This can be especially beneficial if you're looking at intermittent fasting for weight-loss goals.  So first try only eating between 12pm to 8pm.  Outside of this time you may have water, black coffee, and hot tea. You may not eat it drink anything  that has carbs, sugars, OR artificial sugars like diet soda.   Like any major eating and fitness shift, it can take time to find the perfect fit, so don't be afraid to experiment with different options - including ditching intermittent fasting altogether if it's simply not for you. But if it is, you may be surprised by some of the benefits that come along with the strategy.  Are you an emotional eater? Do you eat more when you're feeling stressed? Do you eat when you're not hungry or when you're full? Do you eat to feel better (to calm and soothe yourself when you're sad, mad, bored, anxious, etc.)? Do you reward yourself with food? Do you regularly eat until you've stuffed yourself? Does food make you feel safe? Do you feel like food is a friend? Do you feel powerless or out of control around food?  If you answered yes to some of these questions than it is likely that you are an emotional eater. This is normally a learned behavior and can take time to first recognize the signs and second BREAK THE HABIT. But here is more information and tips to help.   The difference between emotional hunger and physical hunger Emotional hunger can be powerful, so it's easy to mistake it for physical hunger. But there are clues you can look for to help you tell physical and emotional hunger apart.  Emotional hunger comes on suddenly. It hits you in an instant and feels overwhelming and urgent. Physical hunger, on the other hand, comes on more  gradually. The urge to eat doesn't feel as dire or demand instant satisfaction (unless you haven't eaten for a very long time).  Emotional hunger craves specific comfort foods. When you're physically hungry, almost anything sounds good-including healthy stuff like vegetables. But emotional hunger craves junk food or sugary snacks that provide an instant rush. You feel like you need cheesecake or pizza, and nothing else will do.  Emotional hunger often leads to mindless  eating. Before you know it, you've eaten a whole bag of chips or an entire pint of ice cream without really paying attention or fully enjoying it. When you're eating in response to physical hunger, you're typically more aware of what you're doing.  Emotional hunger isn't satisfied once you're full. You keep wanting more and more, often eating until you're uncomfortably stuffed. Physical hunger, on the other hand, doesn't need to be stuffed. You feel satisfied when your stomach is full.  Emotional hunger isn't located in the stomach. Rather than a growling belly or a pang in your stomach, you feel your hunger as a craving you can't get out of your head. You're focused on specific textures, tastes, and smells.  Emotional hunger often leads to regret, guilt, or shame. When you eat to satisfy physical hunger, you're unlikely to feel guilty or ashamed because you're simply giving your body what it needs. If you feel guilty after you eat, it's likely because you know deep down that you're not eating for nutritional reasons.  Identify your emotional eating triggers What situations, places, or feelings make you reach for the comfort of food? Most emotional eating is linked to unpleasant feelings, but it can also be triggered by positive emotions, such as rewarding yourself for achieving a goal or celebrating a holiday or happy event. Common causes of emotional eating include:  Stuffing emotions - Eating can be a way to temporarily silence or "stuff down" uncomfortable emotions, including anger, fear, sadness, anxiety, loneliness, resentment, and shame. While you're numbing yourself with food, you can avoid the difficult emotions you'd rather not feel.  Boredom or feelings of emptiness - Do you ever eat simply to give yourself something to do, to relieve boredom, or as a way to fill a void in your life? You feel unfulfilled and empty, and food is a way to occupy your mouth and your time. In the moment, it fills  you up and distracts you from underlying feelings of purposelessness and dissatisfaction with your life.  Childhood habits - Think back to your childhood memories of food. Did your parents reward good behavior with ice cream, take you out for pizza when you got a good report card, or serve you sweets when you were feeling sad? These habits can often carry over into adulthood. Or your eating may be driven by nostalgia-for cherished memories of grilling burgers in the backyard with your dad or baking and eating cookies with your mom.  Social influences - Getting together with other people for a meal is a great way to relieve stress, but it can also lead to overeating. It's easy to overindulge simply because the food is there or because everyone else is eating. You may also overeat in social situations out of nervousness. Or perhaps your family or circle of friends encourages you to overeat, and it's easier to go along with the group.  Stress - Ever notice how stress makes you hungry? It's not just in your mind. When stress is chronic, as it so often is in our chaotic,  fast-paced world, your body produces high levels of the stress hormone, cortisol. Cortisol triggers cravings for salty, sweet, and fried foods-foods that give you a burst of energy and pleasure. The more uncontrolled stress in your life, the more likely you are to turn to food for emotional relief.  Find other ways to feed your feelings If you don't know how to manage your emotions in a way that doesn't involve food, you won't be able to control your eating habits for very long. Diets so often fail because they offer logical nutritional advice which only works if you have conscious control over your eating habits. It doesn't work when emotions hijack the process, demanding an immediate payoff with food.  In order to stop emotional eating, you have to find other ways to fulfill yourself emotionally. It's not enough to understand the cycle of  emotional eating or even to understand your triggers, although that's a huge first step. You need alternatives to food that you can turn to for emotional fulfillment.  Alternatives to emotional eating If you're depressed or lonely, call someone who always makes you feel better, play with your dog or cat, or look at a favorite photo or cherished memento.  If you're anxious, expend your nervous energy by dancing to your favorite song, squeezing a stress ball, or taking a brisk walk.  If you're exhausted, treat yourself with a hot cup of tea, take a bath, light some scented candles, or wrap yourself in a warm blanket.  If you're bored, read a good book, watch a comedy show, explore the outdoors, or turn to an activity you enjoy (woodworking, playing the guitar, shooting hoops, scrapbooking, etc.).  What is mindful eating? Mindful eating is a practice that develops your awareness of eating habits and allows you to pause between your triggers and your actions. Most emotional eaters feel powerless over their food cravings. When the urge to eat hits, you feel an almost unbearable tension that demands to be fed, right now. Because you've tried to resist in the past and failed, you believe that your willpower just isn't up to snuff. But the truth is that you have more power over your cravings than you think.  Take 5 before you give in to a craving Emotional eating tends to be automatic and virtually mindless. Before you even realize what you're doing, you've reached for a tub of ice cream and polished off half of it. But if you can take a moment to pause and reflect when you're hit with a craving, you give yourself the opportunity to make a different decision.  Can you put off eating for five minutes? Or just start with one minute. Don't tell yourself you can't give in to the craving; remember, the forbidden is extremely tempting. Just tell yourself to wait.  While you're waiting, check in with yourself.  How are you feeling? What's going on emotionally? Even if you end up eating, you'll have a better understanding of why you did it. This can help you set yourself up for a different response next time.  How to practice mindful eating Eating while you're also doing other things-such as watching TV, driving, or playing with your phone-can prevent you from fully enjoying your food. Since your mind is elsewhere, you may not feel satisfied or continue eating even though you're no longer hungry. Eating more mindfully can help focus your mind on your food and the pleasure of a meal and curb overeating.   Eat your meals in  a calm place with no distractions, aside from any dining companions.  Try eating with your non-dominant hand or using chopsticks instead of a knife and fork. Eating in such a non-familiar way can slow down how fast you eat and ensure your mind stays focused on your food.  Allow yourself enough time not to have to rush your meal. Set a timer for 20 minutes and pace yourself so you spend at least that much time eating.  Take small bites and chew them well, taking time to notice the different flavors and textures of each mouthful.  Put your utensils down between bites. Take time to consider how you feel-hungry, satiated-before picking up your utensils again.  Try to stop eating before you are full.It takes time for the signal to reach your brain that you've had enough. Don't feel obligated to always clean your plate.  When you've finished your food, take a few moments to assess if you're really still hungry before opting for an extra serving or dessert.  Learn to accept your feelings-even the bad ones  While it may seem that the core problem is that you're powerless over food, emotional eating actually stems from feeling powerless over your emotions. You don't feel capable of dealing with your feelings head on, so you avoid them with food.  Recommended reading  Mini Habits for weight  loss  Healthy Eating: A guide to the new nutrition - [place] Report  10 Tips for Mindful Eating - How mindfulness can help you fully enjoy a meal and the experience of eating-with moderation and restraint. ([place])  Weight Loss: Gain Control of Emotional Eating - Tips to regain control of your eating habits. [facility])  Why Stress Causes People to Overeat -Tips on controlling stress eating. ([place])  Mindful Eating Meditations -Free online mindfulness meditations. ([facility] [employer])    Veggies are great because you can eat a ton! They are low in calories, great to fill you up, and have a ton of vitamins, minerals, and protein.      What is the TMJ? The temporomandibular (tem-PUH-ro-man-DIB-yoo-ler) joint, or the TMJ, connects the upper and lower jawbones. This joint allows the jaw to open wide and move back and forth when you chew, talk, or yawn.There are also several muscles that help this joint move. There can be muscle tightness and pain in the muscle that can cause several symptoms.  What causes TMJ pain? There are many causes of TMJ pain. Repeated chewing (for example, chewing gum) and clenching your teeth can cause pain in the joint. Some TMJ pain has no obvious cause. What can I do to ease the pain? There are many things you can do to help your pain get better. When you have pain:  Eat soft foods and stay away from chewy foods (for example, taffy) Try to use both sides of your mouth to chew Don't chew gum Massage Don't open your mouth wide (for example, during yawning or singing) Don't bite your cheeks or fingernails Lower your amount of stress and worry Applying a warm, damp washcloth to the joint may help. Over-the-counter pain medicines such as ibuprofen (one brand: Advil) or acetaminophen (one brand: Tylenol) might also help. Do not use these medicines if you are allergic to them or if your  doctor told you not to use them. How can I stop the pain from coming back? When your pain is better, you can do these exercises  to make your muscles stronger and to keep the pain from coming back:  Resisted mouth opening: Place your thumb or two fingers under your chin and open your mouth slowly, pushing up lightly on your chin with your thumb. Hold for three to six seconds. Close your mouth slowly. Resisted mouth closing: Place your thumbs under your chin and your two index fingers on the ridge between your mouth and the bottom of your chin. Push down lightly on your chin as you close your mouth. Tongue up: Slowly open and close your mouth while keeping the tongue touching the roof of the mouth. Side-to-side jaw movement: Place an object about one fourth of an inch thick (for example, two tongue depressors) between your front teeth. Slowly move your jaw from side to side. Increase the thickness of the object as the exercise becomes easier Forward jaw movement: Place an object about one fourth of an inch thick between your front teeth and move the bottom jaw forward so that the bottom teeth are in front of the top teeth. Increase the thickness of the object as the exercise becomes easier. These exercises should not be painful. If it hurts to do these exercises, stop doing them and talk to your family doctor.    Silent reflux: Not all heartburn burns...Marland KitchenMarland KitchenMarland Kitchen  What is LPR? Laryngopharyngeal reflux (LPR) or silent reflux is a condition in which acid that is made in the stomach travels up the esophagus (swallowing tube) and gets to the throat. Not everyone with reflux has a lot of heartburn or indigestion. In fact, many people with LPR never have heartburn. This is why LPR is called SILENT REFLUX, and the terms "Silent reflux" and "LPR" are often used interchangeably. Because LPR is silent, it is sometimes difficult to diagnose.  How can you tell if you have LPR?  Marland Kitchen Chronic hoarseness- Some people have  hoarseness that comes and goes . throat clearing  . Cough . It can cause shortness of breath and cause asthma like symptoms. Marland Kitchen a feeling of a lump in the throat  . difficulty swallowing . a problem with too much nose and throat drainage.  . Some people will feel their esophagus spasm which feels like their heart beating hard and fast, this will usually be after a meal, at rest, or lying down at night.    How do I treat this? Treatment for LPR should be individualized, and your doctor will suggest the best treatment for you. Generally there are several treatments for LPR: . changing habits and diet to reduce reflux,  . medications to reduce stomach acid, and  . surgery to prevent reflux. Most people with LPR need to modify how and when they eat, as well as take some medication, to get well. Sometimes, nonprescription liquid antacids, such as Maalox, Gelucil and Mylanta are recommended. When used, these antacids should be taken four times each day - one tablespoon one hour after each meal and before bedtime. Dietary and lifestyle changes alone are not often enough to control LPR - medications that reduce stomach acid are also usually needed. These must be prescribed by our doctor.   TIPS FOR REDUCING REFLUX AND LPR Control your LIFE-STYLE and your DIET! Marland Kitchen If you use tobacco, QUIT.  Marland Kitchen Smoking makes you reflux. After every cigarette you have some LPR.  . Don't wear clothing that is too tight, especially around the waist (trousers, corsets, belts).  . Do not lie down just after eating...in fact, do not eat within  three hours of bedtime.  . You should be on a low-fat diet.  . Limit your intake of red meat.  . Limit your intake of butter.  Marland Kitchen Avoid fried foods.  . Avoid chocolate  . Avoid cheese.  Marland Kitchen Avoid eggs. Marland Kitchen Specifically avoid caffeine (especially coffee and tea), soda pop (especially cola) and mints.  . Avoid alcoholic beverages, particularly in the evening.

## 2017-07-13 ENCOUNTER — Other Ambulatory Visit: Payer: Self-pay | Admitting: Physician Assistant

## 2017-07-13 DIAGNOSIS — E782 Mixed hyperlipidemia: Secondary | ICD-10-CM

## 2017-07-13 LAB — HEPATIC FUNCTION PANEL
AG RATIO: 1.8 (calc) (ref 1.0–2.5)
ALBUMIN MSPROF: 4.6 g/dL (ref 3.6–5.1)
ALT: 28 U/L (ref 6–29)
AST: 20 U/L (ref 10–35)
Alkaline phosphatase (APISO): 72 U/L (ref 33–115)
BILIRUBIN DIRECT: 0.1 mg/dL (ref 0.0–0.2)
BILIRUBIN TOTAL: 0.4 mg/dL (ref 0.2–1.2)
GLOBULIN: 2.5 g/dL (ref 1.9–3.7)
Indirect Bilirubin: 0.3 mg/dL (calc) (ref 0.2–1.2)
Total Protein: 7.1 g/dL (ref 6.1–8.1)

## 2017-07-13 LAB — CBC WITH DIFFERENTIAL/PLATELET
BASOS PCT: 0.5 %
Basophils Absolute: 31 cells/uL (ref 0–200)
EOS PCT: 1.8 %
Eosinophils Absolute: 110 cells/uL (ref 15–500)
HCT: 40.3 % (ref 35.0–45.0)
HEMOGLOBIN: 13.6 g/dL (ref 11.7–15.5)
Lymphs Abs: 2147 cells/uL (ref 850–3900)
MCH: 30.7 pg (ref 27.0–33.0)
MCHC: 33.7 g/dL (ref 32.0–36.0)
MCV: 91 fL (ref 80.0–100.0)
MONOS PCT: 7 %
MPV: 12.1 fL (ref 7.5–12.5)
NEUTROS ABS: 3386 {cells}/uL (ref 1500–7800)
Neutrophils Relative %: 55.5 %
Platelets: 253 10*3/uL (ref 140–400)
RBC: 4.43 10*6/uL (ref 3.80–5.10)
RDW: 12 % (ref 11.0–15.0)
Total Lymphocyte: 35.2 %
WBC mixed population: 427 cells/uL (ref 200–950)
WBC: 6.1 10*3/uL (ref 3.8–10.8)

## 2017-07-13 LAB — LIPID PANEL
CHOL/HDL RATIO: 4.5 (calc) (ref <5.0)
Cholesterol: 254 mg/dL — ABNORMAL HIGH (ref <200)
HDL: 56 mg/dL (ref >50)
LDL CHOLESTEROL (CALC): 170 mg/dL — AB
NON-HDL CHOLESTEROL (CALC): 198 mg/dL — AB (ref <130)
Triglycerides: 142 mg/dL (ref <150)

## 2017-07-13 LAB — URINALYSIS, ROUTINE W REFLEX MICROSCOPIC
Bilirubin Urine: NEGATIVE
Glucose, UA: NEGATIVE
HGB URINE DIPSTICK: NEGATIVE
KETONES UR: NEGATIVE
Leukocytes, UA: NEGATIVE
NITRITE: NEGATIVE
PROTEIN: NEGATIVE
Specific Gravity, Urine: 1.009 (ref 1.001–1.03)
pH: 6 (ref 5.0–8.0)

## 2017-07-13 LAB — BASIC METABOLIC PANEL WITH GFR
BUN: 16 mg/dL (ref 7–25)
CALCIUM: 10.1 mg/dL (ref 8.6–10.2)
CO2: 30 mmol/L (ref 20–32)
Chloride: 105 mmol/L (ref 98–110)
Creat: 0.93 mg/dL (ref 0.50–1.10)
GFR, EST AFRICAN AMERICAN: 85 mL/min/{1.73_m2} (ref >=60)
GFR, EST NON AFRICAN AMERICAN: 74 mL/min/{1.73_m2} (ref >=60)
Glucose, Bld: 98 mg/dL (ref 65–99)
POTASSIUM: 5.2 mmol/L (ref 3.5–5.3)
Sodium: 142 mmol/L (ref 135–146)

## 2017-07-13 LAB — MICROALBUMIN / CREATININE URINE RATIO: CREATININE, URINE: 45 mg/dL (ref 20–275)

## 2017-07-13 LAB — TSH: TSH: 1.2 m[IU]/L

## 2017-07-13 LAB — MAGNESIUM: MAGNESIUM: 2 mg/dL (ref 1.5–2.5)

## 2017-07-24 ENCOUNTER — Encounter: Payer: Self-pay | Admitting: Gynecology

## 2017-07-24 ENCOUNTER — Ambulatory Visit (INDEPENDENT_AMBULATORY_CARE_PROVIDER_SITE_OTHER): Payer: 59 | Admitting: Gynecology

## 2017-07-24 VITALS — BP 118/76 | Ht 67.0 in | Wt 193.0 lb

## 2017-07-24 DIAGNOSIS — Z01419 Encounter for gynecological examination (general) (routine) without abnormal findings: Secondary | ICD-10-CM

## 2017-07-24 NOTE — Patient Instructions (Signed)
Follow-up in 1 year for annual exam, sooner if any issues. 

## 2017-07-24 NOTE — Progress Notes (Signed)
    Kathryn Larsen 1970-07-01 [id number]        47 y.o.  [id number] for annual gynecologic exam.  Doing well without complaints.  Past medical history,surgical history, problem list, medications, allergies, family history and social history were all reviewed and documented as reviewed in the EPIC chart.  ROS:  Performed with pertinent positives and negatives included in the history, assessment and plan.   Additional significant findings : None   Exam: [name] assistant Vitals:   07/24/17 1203  BP: 118/76  Weight: 193 lb (87.5 kg)  Height: 5\' 7"  (1.702 m)   Body mass index is 30.23 kg/m.  General appearance:  Normal affect, orientation and appearance. Skin: Grossly normal HEENT: Without gross lesions.  No cervical or supraclavicular adenopathy. Thyroid normal.  Lungs:  Clear without wheezing, rales or rhonchi Cardiac: RR, without RMG Abdominal:  Soft, nontender, without masses, guarding, rebound, organomegaly or hernia Breasts:  Examined lying and sitting without masses, retractions, discharge or axillary adenopathy. Pelvic:  Ext, BUS, Vagina: Normal  Adnexa: Without masses or tenderness    Anus and perineum: Normal   Rectovaginal: Normal sphincter tone without palpated masses or tenderness.    Assessment/Plan:  47 y.o. G52P1102 female for annual gynecologic exam.    1. Status post LAVH bilateral salpingectomies 2017 for persistent pelvic pain, endometrial polyp and hematometria with persistent LGSIL/positive high risk HPV.  Final pathology showed high grade dysplasia with clear margins.  Pap smear/HPV negative 2018.  No Pap smear done today.  2. Mammography due now and patient will arrange.  Breast exam normal today. 3. Health maintenance.  Recently had routine lab work through her primary physician's office.  Being followed for hypercholesterolemia.  Follow-up in 1 year, sooner as needed.     [name] MD, 12:38 PM 07/24/2017

## 2017-08-02 ENCOUNTER — Ambulatory Visit
Admission: RE | Admit: 2017-08-02 | Discharge: 2017-08-02 | Disposition: A | Discharge status: Home / Self Care | Payer: 59 | Source: Ambulatory Visit | Attending: Gynecology | Admitting: Gynecology

## 2017-08-02 DIAGNOSIS — Z139 Encounter for screening, unspecified: Secondary | ICD-10-CM

## 2017-12-19 ENCOUNTER — Encounter: Payer: Self-pay | Admitting: Physician Assistant

## 2017-12-19 ENCOUNTER — Ambulatory Visit (INDEPENDENT_AMBULATORY_CARE_PROVIDER_SITE_OTHER): Payer: No Typology Code available for payment source | Admitting: Physician Assistant

## 2017-12-19 VITALS — BP 108/80 | HR 101 | Temp 97.8°F | Resp 16 | Ht 66.0 in | Wt 197.2 lb

## 2017-12-19 DIAGNOSIS — Z79899 Other long term (current) drug therapy: Secondary | ICD-10-CM | POA: Diagnosis not present

## 2017-12-19 DIAGNOSIS — I10 Essential (primary) hypertension: Secondary | ICD-10-CM

## 2017-12-19 DIAGNOSIS — E782 Mixed hyperlipidemia: Secondary | ICD-10-CM | POA: Diagnosis not present

## 2017-12-19 DIAGNOSIS — Z111 Encounter for screening for respiratory tuberculosis: Secondary | ICD-10-CM | POA: Diagnosis not present

## 2017-12-19 MED ORDER — PHENTERMINE HCL 37.5 MG PO TABS: 37.5000 mg | ORAL_TABLET | Freq: Every day | ORAL | 2 refills | Status: DC

## 2017-12-19 MED FILL — PHENTERMINE 37.5 MG TABLET: 37.5 | 30 days supply | Qty: 30 | Fill #0

## 2017-12-19 NOTE — Patient Instructions (Signed)
Check out  Mini habits for weight loss book  2 apps for tracking food is myfitness pal  loseit OR can take picture of your food  Look up mindful eating  Phentermine  While taking the medication we may ask that you come into the office once a month for the first month for a blood pressure check and EKG.   Also please bring in a food log for that visit to review. It is helpful if you bring in a food diary or use an app on your phone such as myfitnesspal to record your calorie intake, especially in the beginning. BRING FOR YOUR FIRST VISIT.  After that first initial visit, we will want to see you once every 2-3 months to monitor your weight, blood pressure, and heart rate.   In addition we can help answer your questions about diet, exercise, and help you every step of the way with your weight loss journey.  You can start out on 1/3 to 1/2 a pill in the morning and if you are tolerating it well you can increase to one pill daily. I also have some patients that take 1/3 or 1/2 at lunch to help prevent night time eating.  This medication is cheapest CASH pay at [place] is 14-17 dollars and you do NOT need a membership to get meds from there.   It causes dry mouth and constipation in almost every patient, so try to get 80-100 oz of water a day and increase fiber such as veggies. You can add on a stool softener if you would like.   It can give you energy however it can also cause some people to be shaky, anxious or have palpitations. Stop this medication if that happens and contact the office.   If this medication does not work for you there are several medications that we can try to help rewire your brain in addition to making healthier habits.   What is this medicine? PHENTERMINE (FEN ter meen) decreases your appetite. This medicine is intended to be used in addition to a healthy reduced calorie diet and exercise. The best results are achieved this way. This medicine  is only indicated for short-term use. Eventually your weight loss may level out and the medication will no longer be needed.   How should I use this medicine? Take this medicine by mouth. Follow the directions on the prescription label. The tablets should stay in the bottle until immediately before you take your dose. Take your doses at regular intervals. Do not take your medicine more often than directed.  Overdosage: If you think you have taken too much of this medicine contact a poison control center or emergency room at once. NOTE: This medicine is only for you. Do not share this medicine with others.  What if I miss a dose? If you miss a dose, take it as soon as you can. If it is almost time for your next dose, take only that dose. Do not take double or extra doses. Do not increase or in any way change your dose without consulting your doctor.  What should I watch for while using this medicine? Notify your physician immediately if you become short of breath while doing your normal activities. Do not take this medicine within 6 hours of bedtime. It can keep you from getting to sleep. Avoid drinks that contain caffeine and try to stick to a regular bedtime every night. Do not stand or sit up quickly,  especially if you are an older patient. This reduces the risk of dizzy or fainting spells. Avoid alcoholic drinks.  What side effects may I notice from receiving this medicine? Side effects that you should report to your doctor or health care professional as soon as possible: -chest pain, palpitations -depression or severe changes in mood -increased blood pressure -irritability -nervousness or restlessness -severe dizziness -shortness of breath -problems urinating -unusual swelling of the legs -vomiting  Side effects that usually do not require medical attention (report to your doctor or health care professional if they continue or are bothersome): -blurred vision or other eye  problems -changes in sexual ability or desire -constipation or diarrhea -difficulty sleeping -dry mouth or unpleasant taste -headache -nausea This list may not describe all possible side effects. Call your doctor for medical advice about side effects. You may report side effects to FDA at 1-800-FDA-1088.  Being dehydrated can hurt your kidneys, cause fatigue, headaches, muscle aches, joint pain, and dry skin/nails so please increase your fluids.   Drink 80-100 oz a day of water, measure it out! Eat 3 meals a day, have to do breakfast, eat protein- hard boiled eggs, protein bar like [place] protein bar, greek yogurt like oikos triple zero, chobani 100, or light n fit greek  Can check out plantnanny app on your phone to help you keep track of your water   Are you an emotional eater? Do you eat more when you're feeling stressed? Do you eat when you're not hungry or when you're full? Do you eat to feel better (to calm and soothe yourself when you're sad, mad, bored, anxious, etc.)? Do you reward yourself with food? Do you regularly eat until you've stuffed yourself? Does food make you feel safe? Do you feel like food is a friend? Do you feel powerless or out of control around food?  If you answered yes to some of these questions than it is likely that you are an emotional eater. This is normally a learned behavior and can take time to first recognize the signs and second BREAK THE HABIT. But here is more information and tips to help.   The difference between emotional hunger and physical hunger Emotional hunger can be powerful, so it's easy to mistake it for physical hunger. But there are clues you can look for to help you tell physical and emotional hunger apart.  Emotional hunger comes on suddenly. It hits you in an instant and feels overwhelming and urgent. Physical hunger, on the other hand, comes on more gradually. The urge to eat doesn't feel as dire or demand instant  satisfaction (unless you haven't eaten for a very long time).  Emotional hunger craves specific comfort foods. When you're physically hungry, almost anything sounds good-including healthy stuff like vegetables. But emotional hunger craves junk food or sugary snacks that provide an instant rush. You feel like you need cheesecake or pizza, and nothing else will do.  Emotional hunger often leads to mindless eating. Before you know it, you've eaten a whole bag of chips or an entire pint of ice cream without really paying attention or fully enjoying it. When you're eating in response to physical hunger, you're typically more aware of what you're doing.  Emotional hunger isn't satisfied once you're full. You keep wanting more and more, often eating until you're uncomfortably stuffed. Physical hunger, on the other hand, doesn't need to be stuffed. You feel satisfied when your stomach is full.  Emotional hunger isn't located in the  stomach. Rather than a growling belly or a pang in your stomach, you feel your hunger as a craving you can't get out of your head. You're focused on specific textures, tastes, and smells.  Emotional hunger often leads to regret, guilt, or shame. When you eat to satisfy physical hunger, you're unlikely to feel guilty or ashamed because you're simply giving your body what it needs. If you feel guilty after you eat, it's likely because you know deep down that you're not eating for nutritional reasons.  Identify your emotional eating triggers What situations, places, or feelings make you reach for the comfort of food? Most emotional eating is linked to unpleasant feelings, but it can also be triggered by positive emotions, such as rewarding yourself for achieving a goal or celebrating a holiday or happy event. Common causes of emotional eating include:  Stuffing emotions - Eating can be a way to temporarily silence or "stuff down" uncomfortable emotions, including anger, fear, sadness,  anxiety, loneliness, resentment, and shame. While you're numbing yourself with food, you can avoid the difficult emotions you'd rather not feel.  Boredom or feelings of emptiness - Do you ever eat simply to give yourself something to do, to relieve boredom, or as a way to fill a void in your life? You feel unfulfilled and empty, and food is a way to occupy your mouth and your time. In the moment, it fills you up and distracts you from underlying feelings of purposelessness and dissatisfaction with your life.  Childhood habits - Think back to your childhood memories of food. Did your parents reward good behavior with ice cream, take you out for pizza when you got a good report card, or serve you sweets when you were feeling sad? These habits can often carry over into adulthood. Or your eating may be driven by nostalgia-for cherished memories of grilling burgers in the backyard with your dad or baking and eating cookies with your mom.  Social influences - Getting together with other people for a meal is a great way to relieve stress, but it can also lead to overeating. It's easy to overindulge simply because the food is there or because everyone else is eating. You may also overeat in social situations out of nervousness. Or perhaps your family or circle of friends encourages you to overeat, and it's easier to go along with the group.  Stress - Ever notice how stress makes you hungry? It's not just in your mind. When stress is chronic, as it so often is in our chaotic, fast-paced world, your body produces high levels of the stress hormone, cortisol. Cortisol triggers cravings for salty, sweet, and fried foods-foods that give you a burst of energy and pleasure. The more uncontrolled stress in your life, the more likely you are to turn to food for emotional relief.  Find other ways to feed your feelings If you don't know how to manage your emotions in a way that doesn't involve food, you won't be able to  control your eating habits for very long. Diets so often fail because they offer logical nutritional advice which only works if you have conscious control over your eating habits. It doesn't work when emotions hijack the process, demanding an immediate payoff with food.  In order to stop emotional eating, you have to find other ways to fulfill yourself emotionally. It's not enough to understand the cycle of emotional eating or even to understand your triggers, although that's a huge first step. You need alternatives to  food that you can turn to for emotional fulfillment.  Alternatives to emotional eating If you're depressed or lonely, call someone who always makes you feel better, play with your dog or cat, or look at a favorite photo or cherished memento.  If you're anxious, expend your nervous energy by dancing to your favorite song, squeezing a stress ball, or taking a brisk walk.  If you're exhausted, treat yourself with a hot cup of tea, take a bath, light some scented candles, or wrap yourself in a warm blanket.  If you're bored, read a good book, watch a comedy show, explore the outdoors, or turn to an activity you enjoy (woodworking, playing the guitar, shooting hoops, scrapbooking, etc.).  What is mindful eating? Mindful eating is a practice that develops your awareness of eating habits and allows you to pause between your triggers and your actions. Most emotional eaters feel powerless over their food cravings. When the urge to eat hits, you feel an almost unbearable tension that demands to be fed, right now. Because you've tried to resist in the past and failed, you believe that your willpower just isn't up to snuff. But the truth is that you have more power over your cravings than you think.  Take 5 before you give in to a craving Emotional eating tends to be automatic and virtually mindless. Before you even realize what you're doing, you've reached for a tub of ice cream and polished off  half of it. But if you can take a moment to pause and reflect when you're hit with a craving, you give yourself the opportunity to make a different decision.  Can you put off eating for five minutes? Or just start with one minute. Don't tell yourself you can't give in to the craving; remember, the forbidden is extremely tempting. Just tell yourself to wait.  While you're waiting, check in with yourself. How are you feeling? What's going on emotionally? Even if you end up eating, you'll have a better understanding of why you did it. This can help you set yourself up for a different response next time.  How to practice mindful eating Eating while you're also doing other things-such as watching TV, driving, or playing with your phone-can prevent you from fully enjoying your food. Since your mind is elsewhere, you may not feel satisfied or continue eating even though you're no longer hungry. Eating more mindfully can help focus your mind on your food and the pleasure of a meal and curb overeating.   Eat your meals in a calm place with no distractions, aside from any dining companions.  Try eating with your non-dominant hand or using chopsticks instead of a knife and fork. Eating in such a non-familiar way can slow down how fast you eat and ensure your mind stays focused on your food.  Allow yourself enough time not to have to rush your meal. Set a timer for 20 minutes and pace yourself so you spend at least that much time eating.  Take small bites and chew them well, taking time to notice the different flavors and textures of each mouthful.  Put your utensils down between bites. Take time to consider how you feel-hungry, satiated-before picking up your utensils again.  Try to stop eating before you are full.It takes time for the signal to reach your brain that you've had enough. Don't feel obligated to always clean your plate.  When you've finished your food, take a few moments to assess if you're  really  still hungry before opting for an extra serving or dessert.  Learn to accept your feelings-even the bad ones  While it may seem that the core problem is that you're powerless over food, emotional eating actually stems from feeling powerless over your emotions. You don't feel capable of dealing with your feelings head on, so you avoid them with food.  Recommended reading  Mini Habits for weight loss  Healthy Eating: A guide to the new nutrition - [place] Report  10 Tips for Mindful Eating - How mindfulness can help you fully enjoy a meal and the experience of eating-with moderation and restraint. ([place])  Weight Loss: Gain Control of Emotional Eating - Tips to regain control of your eating habits. [facility])  Why Stress Causes People to Overeat -Tips on controlling stress eating. ([place])  Mindful Eating Meditations -Free online mindfulness meditations. ([facility])

## 2017-12-19 NOTE — Addendum Note (Signed)
Addended by: Vicie Mutters R on: 12/19/2017 04:46 PM   Modules accepted: Orders

## 2017-12-19 NOTE — Progress Notes (Signed)
Subjective:    Patient ID: Kathryn Larsen, female    DOB: [birth year], 47 y.o.   MRN: [id number]  HPI 47 y.o. WF presents for school forms and needs a recheck of her cholesterol.   She has a family history of cholesterol, mom had rhabdomyolysis from statins.  Lab Results  Component Value Date   CHOL 254 (H) 07/12/2017   HDL 56 07/12/2017   LDLCALC 170 (H) 07/12/2017   TRIG 142 07/12/2017   CHOLHDL 4.5 07/12/2017   BMI is Body mass index is 31.83 kg/m., she is working on diet and exercise. Wt Readings from Last 3 Encounters:  12/19/17 197 lb 3.2 oz (89.4 kg)  07/24/17 193 lb (87.5 kg)  07/12/17 196 lb 3.2 oz (89 kg)     Blood pressure 108/80, pulse (!) 101, temperature 97.8 F (36.6 C), resp. rate 16, height 5\' 6"  (1.676 m), weight 197 lb 3.2 oz (89.4 kg), SpO2 98 %.  Medications Current Outpatient Medications on File Prior to Visit  Medication Sig  . aspirin EC 81 MG tablet Take 81 mg by mouth daily.  . Cetirizine-Pseudoephedrine (ZYRTEC-D PO) Take by mouth daily.  . Cholecalciferol (VITAMIN D PO) Take by mouth daily.  . Cyanocobalamin (B-12 SL) Place under the tongue.  . fexofenadine (ALLEGRA) 180 MG tablet Take 180 mg by mouth daily.  Marland Kitchen ibuprofen (ADVIL,MOTRIN) 200 MG tablet Take 200 mg by mouth every 6 (six) hours as needed for mild pain.  . Red Yeast Rice Extract (RED YEAST RICE PO) Take by mouth 2 (two) times daily.   No current facility-administered medications on file prior to visit.     Problem list She has Acid reflux; Eczema; and PFO (patent foramen ovale) on their problem list.   Review of Systems  Constitutional: Negative.   HENT: Negative.   Respiratory: Negative.   Cardiovascular: Negative.   Gastrointestinal: Negative.   Genitourinary: Negative.   Musculoskeletal: Negative.   Skin: Negative.   Neurological: Negative.   Hematological: Negative.   Psychiatric/Behavioral: Negative.        Objective:   Physical Exam  Constitutional: She is  oriented to person, place, and time. She appears well-developed and well-nourished.  HENT:  Head: Normocephalic and atraumatic.  Right Ear: External ear normal.  Left Ear: External ear normal.  Mouth/Throat: Oropharynx is clear and moist.  Eyes: Pupils are equal, round, and reactive to light. Conjunctivae and EOM are normal.  Neck: Normal range of motion. Neck supple. No thyromegaly present.  Cardiovascular: Normal rate, regular rhythm and normal heart sounds. Exam reveals no gallop and no friction rub.  No murmur heard. Pulmonary/Chest: Effort normal and breath sounds normal. No respiratory distress. She has no wheezes.  Abdominal: Soft. Bowel sounds are normal. She exhibits no distension and no mass. There is no tenderness. There is no rebound and no guarding.  Musculoskeletal: Normal range of motion.  Lymphadenopathy:    She has no cervical adenopathy.  Neurological: She is alert and oriented to person, place, and time. She displays normal reflexes. No cranial nerve deficit. Coordination normal.  Skin: Skin is warm and dry.  Psychiatric: She has a normal mood and affect.       Assessment & Plan:    PPD screening test -     TB Skin Test  Mixed hyperlipidemia -     Lipid panel - may add crestor  Medication management -     CBC with Differential/Platelet -     COMPLETE METABOLIC PANEL WITH  GFR  Essential hypertension -     Urinalysis, Routine w reflex microscopic -     CBC with Differential/Platelet -     COMPLETE METABOLIC PANEL WITH GFR -     TSH  Overweight -     phentermine (ADIPEX-P) 37.5 MG tablet; Take 1 tablet (37.5 mg total) by mouth daily before breakfast. - 4-6 weeks follow up

## 2017-12-20 LAB — COMPLETE METABOLIC PANEL WITH GFR
AG RATIO: 1.8 (calc) (ref 1.0–2.5)
ALKALINE PHOSPHATASE (APISO): 76 U/L (ref 33–115)
ALT: 18 U/L (ref 6–29)
AST: 18 U/L (ref 10–35)
Albumin: 4.2 g/dL (ref 3.6–5.1)
BUN: 15 mg/dL (ref 7–25)
CHLORIDE: 104 mmol/L (ref 98–110)
CO2: 29 mmol/L (ref 20–32)
Calcium: 9.8 mg/dL (ref 8.6–10.2)
Creat: 1.08 mg/dL (ref 0.50–1.10)
GFR, Est African American: 71 mL/min/{1.73_m2} (ref >=60)
GFR, Est Non African American: 62 mL/min/{1.73_m2} (ref >=60)
GLOBULIN: 2.4 g/dL (ref 1.9–3.7)
Glucose, Bld: 93 mg/dL (ref 65–99)
Potassium: 4.9 mmol/L (ref 3.5–5.3)
SODIUM: 142 mmol/L (ref 135–146)
Total Bilirubin: 0.4 mg/dL (ref 0.2–1.2)
Total Protein: 6.6 g/dL (ref 6.1–8.1)

## 2017-12-20 LAB — CBC WITH DIFFERENTIAL/PLATELET
Basophils Absolute: 20 cells/uL (ref 0–200)
Basophils Relative: 0.3 %
Eosinophils Absolute: 143 cells/uL (ref 15–500)
Eosinophils Relative: 2.2 %
HCT: 40.3 % (ref 35.0–45.0)
HEMOGLOBIN: 13.4 g/dL (ref 11.7–15.5)
Lymphs Abs: 2054 cells/uL (ref 850–3900)
MCH: 30.1 pg (ref 27.0–33.0)
MCHC: 33.3 g/dL (ref 32.0–36.0)
MCV: 90.6 fL (ref 80.0–100.0)
MPV: 12.1 fL (ref 7.5–12.5)
Monocytes Relative: 6.5 %
NEUTROS ABS: 3861 {cells}/uL (ref 1500–7800)
Neutrophils Relative %: 59.4 %
Platelets: 217 10*3/uL (ref 140–400)
RBC: 4.45 10*6/uL (ref 3.80–5.10)
RDW: 11.9 % (ref 11.0–15.0)
Total Lymphocyte: 31.6 %
WBC mixed population: 423 cells/uL (ref 200–950)
WBC: 6.5 10*3/uL (ref 3.8–10.8)

## 2017-12-20 LAB — URINALYSIS, ROUTINE W REFLEX MICROSCOPIC
Bilirubin Urine: NEGATIVE
GLUCOSE, UA: NEGATIVE
Hgb urine dipstick: NEGATIVE
Ketones, ur: NEGATIVE
LEUKOCYTES UA: NEGATIVE
Nitrite: NEGATIVE
Protein, ur: NEGATIVE
Specific Gravity, Urine: 1.015 (ref 1.001–1.03)
pH: 7 (ref 5.0–8.0)

## 2017-12-20 LAB — LIPID PANEL
Cholesterol: 220 mg/dL — ABNORMAL HIGH (ref <200)
HDL: 45 mg/dL — AB (ref >50)
LDL Cholesterol (Calc): 135 mg/dL (calc) — ABNORMAL HIGH
Non-HDL Cholesterol (Calc): 175 mg/dL (calc) — ABNORMAL HIGH (ref <130)
Total CHOL/HDL Ratio: 4.9 (calc) (ref <5.0)
Triglycerides: 257 mg/dL — ABNORMAL HIGH (ref <150)

## 2017-12-20 LAB — TSH: TSH: 0.87 mIU/L

## 2017-12-21 LAB — TB SKIN TEST
Induration: 0 mm
TB SKIN TEST: NEGATIVE

## 2018-01-15 NOTE — Progress Notes (Deleted)
47 y.o.female presents for a follow up after being on phentermine for weight loss.  While on the medication they have lost {NUMBERS 0-12:18577} lbs since last visit. They deny palpitations, anxiety, trouble sleeping, elevated BP.   BP Readings from Last 3 Encounters:  12/19/17 108/80  07/24/17 118/76  07/12/17 124/66    An EKG was obtained at today's visit.   BMI is There is no height or weight on file to calculate BMI., she is working on diet and exercise. Wt Readings from Last 3 Encounters:  12/19/17 197 lb 3.2 oz (89.4 kg)  07/24/17 193 lb (87.5 kg)  07/12/17 196 lb 3.2 oz (89 kg)    Medications: Current Outpatient Medications on File Prior to Visit  Medication Sig Dispense Refill  . aspirin EC 81 MG tablet Take 81 mg by mouth daily.    . Cetirizine-Pseudoephedrine (ZYRTEC-D PO) Take by mouth daily.    . Cholecalciferol (VITAMIN D PO) Take by mouth daily.    . Cyanocobalamin (B-12 SL) Place under the tongue.    . fexofenadine (ALLEGRA) 180 MG tablet Take 180 mg by mouth daily.    Marland Kitchen ibuprofen (ADVIL,MOTRIN) 200 MG tablet Take 200 mg by mouth every 6 (six) hours as needed for mild pain.    . phentermine (ADIPEX-P) 37.5 MG tablet Take 1 tablet (37.5 mg total) by mouth daily before breakfast. 30 tablet 2  . Red Yeast Rice Extract (RED YEAST RICE PO) Take by mouth 2 (two) times daily.     No current facility-administered medications on file prior to visit.     ROS: All negative except for above  Physical exam: There were no vitals filed for this visit. Physical Exam  Assessment: Obesity with co morbid conditions.   Plan: General weight loss/lifestyle modification strategies discussed (elicit support from others; identify saboteurs; non-food rewards, etc). Continue food diary Continue restricted calorie diet Continue daily exercise as well as behavior modification such as walking further away, putting down the fork, having a plan, using stairs, etc.  Medication: phentermine   Follow up in {NUMBERS;1-7 BY 1:408017} months Future Appointments  Date Time Provider [place]  01/17/2018  3:30 PM Vicie Mutters, PA-C [facility] None  07/17/2018  2:00 PM Vicie Mutters, PA-C [facility] None  07/26/2018 12:00 PM Fontaine, Belinda Block, MD [facility]    [id number] 15 mins [id number] 30 mins [id number] 45 mins 99404 60 mins

## 2018-01-17 ENCOUNTER — Ambulatory Visit: Payer: Self-pay | Admitting: Physician Assistant

## 2018-01-22 NOTE — Progress Notes (Signed)
47 y.o.female presents for a follow up after being on phentermine for weight loss.  While on the medication they have lost 2 lbs since last visit. They deny palpitations, anxiety, trouble sleeping, elevated BP.   BP Readings from Last 3 Encounters:  01/24/18 128/76  12/19/17 108/80  07/24/17 118/76   BMI is Body mass index is 31.47 kg/m., she is working on diet and exercise. Wt Readings from Last 3 Encounters:  01/24/18 195 lb (88.5 kg)  12/19/17 197 lb 3.2 oz (89.4 kg)  07/24/17 193 lb (87.5 kg)    Medications: Current Outpatient Medications on File Prior to Visit  Medication Sig Dispense Refill  . aspirin EC 81 MG tablet Take 81 mg by mouth daily.    . Cetirizine-Pseudoephedrine (ZYRTEC-D PO) Take by mouth daily.    . Cholecalciferol (VITAMIN D PO) Take by mouth daily.    . Cyanocobalamin (B-12 SL) Place under the tongue.    . fexofenadine (ALLEGRA) 180 MG tablet Take 180 mg by mouth daily.    Marland Kitchen ibuprofen (ADVIL,MOTRIN) 200 MG tablet Take 200 mg by mouth every 6 (six) hours as needed for mild pain.    . phentermine (ADIPEX-P) 37.5 MG tablet Take 1 tablet (37.5 mg total) by mouth daily before breakfast. 30 tablet 2  . Red Yeast Rice Extract (RED YEAST RICE PO) Take by mouth 2 (two) times daily.     No current facility-administered medications on file prior to visit.     ROS: All negative except for above  Physical exam: Vitals:   01/24/18 1440  BP: 128/76  Pulse: 84  Resp: 16  Temp: 97.7 F (36.5 C)  SpO2: 98%   Physical Exam  Constitutional: She is oriented to person, place, and time. She appears well-developed and well-nourished.  HENT:  Head: Normocephalic and atraumatic.  Right Ear: External ear normal.  Left Ear: External ear normal.  Mouth/Throat: Oropharynx is clear and moist.  Eyes: Pupils are equal, round, and reactive to light. Conjunctivae and EOM are normal.  Neck: Normal range of motion. Neck supple. No thyromegaly present.  Cardiovascular: Normal  rate, regular rhythm and normal heart sounds. Exam reveals no gallop and no friction rub.  No murmur heard. Pulmonary/Chest: Effort normal and breath sounds normal. No respiratory distress. She has no wheezes.  Abdominal: Soft. Bowel sounds are normal. She exhibits no distension and no mass. There is no tenderness. There is no rebound and no guarding.  Musculoskeletal: Normal range of motion.  Lymphadenopathy:    She has no cervical adenopathy.  Neurological: She is alert and oriented to person, place, and time. She displays normal reflexes. No cranial nerve deficit. Coordination normal.  Skin: Skin is warm and dry.  Psychiatric: She has a normal mood and affect.    Assessment: Obesity with co morbid conditions.   Plan: General weight loss/lifestyle modification strategies discussed (elicit support from others; identify saboteurs; non-food rewards, etc). Continue food diary Continue restricted calorie diet Continue daily exercise as well as behavior modification such as walking further away, putting down the fork, having a plan, using stairs, etc.  Medication: phentermine  Follow up in 2 months Future Appointments  Date Time Provider [place]  07/17/2018  2:00 PM Vicie Mutters, PA-C [facility] None  07/26/2018 12:00 PM Fontaine, Belinda Block, MD [facility] [place]

## 2018-01-24 ENCOUNTER — Encounter: Payer: Self-pay | Admitting: Physician Assistant

## 2018-01-24 ENCOUNTER — Ambulatory Visit (INDEPENDENT_AMBULATORY_CARE_PROVIDER_SITE_OTHER): Payer: No Typology Code available for payment source | Admitting: Physician Assistant

## 2018-01-24 VITALS — BP 128/76 | HR 84 | Temp 97.7°F | Resp 16 | Ht 66.0 in | Wt 195.0 lb

## 2018-01-24 DIAGNOSIS — Z6831 Body mass index (BMI) 31.0-31.9, adult: Secondary | ICD-10-CM

## 2018-01-24 DIAGNOSIS — Z79899 Other long term (current) drug therapy: Secondary | ICD-10-CM | POA: Diagnosis not present

## 2018-01-24 MED FILL — PHENTERMINE 37.5 MG TABLET: 37.5 | 30 days supply | Qty: 30 | Fill #1

## 2018-01-24 NOTE — Patient Instructions (Addendum)
Check out mindful eating Before you put food in your mouth, rate on a scale of 1-10 how hungry you are  Can try 1/2 of the phentermine with food if you are doing the intermittent fasting  Continue to keep food log  Follow up 2 months   Veggies are great because you can eat a ton! They are low in calories, great to fill you up, and have a ton of vitamins, minerals, and protein.

## 2018-02-05 ENCOUNTER — Ambulatory Visit (INDEPENDENT_AMBULATORY_CARE_PROVIDER_SITE_OTHER): Payer: No Typology Code available for payment source | Admitting: Physician Assistant

## 2018-02-05 ENCOUNTER — Encounter: Payer: Self-pay | Admitting: Physician Assistant

## 2018-02-05 VITALS — BP 132/74 | HR 84 | Temp 97.4°F | Resp 16 | Ht 66.0 in | Wt 193.0 lb

## 2018-02-05 DIAGNOSIS — J069 Acute upper respiratory infection, unspecified: Secondary | ICD-10-CM

## 2018-02-05 DIAGNOSIS — B9789 Other viral agents as the cause of diseases classified elsewhere: Secondary | ICD-10-CM | POA: Diagnosis not present

## 2018-02-05 DIAGNOSIS — Z6831 Body mass index (BMI) 31.0-31.9, adult: Secondary | ICD-10-CM | POA: Diagnosis not present

## 2018-02-05 MED ORDER — BUPROPION HCL ER (XL) 150 MG PO TB24: 150.0000 mg | ORAL_TABLET | ORAL | 2 refills | Status: DC

## 2018-02-05 MED ORDER — PREDNISONE 20 MG PO TABS: ORAL_TABLET | ORAL | 0 refills | Status: DC

## 2018-02-05 MED FILL — buPROPion HCL ER (XL) 150 M: 150 | 30 days supply | Qty: 30 | Fill #0

## 2018-02-05 MED FILL — predniSONE 20 MG TABS: 20 | 7 days supply | Qty: 10 | Fill #0

## 2018-02-05 NOTE — Patient Instructions (Signed)
Make sure you are on an allergy pill, see below for more details. Please take the prednisone as directed below, this is NOT an antibiotic so you do NOT have to finish it. You can take it for a few days and stop it if you are doing better.   Please take the prednisone to help decrease inflammation and therefore decrease symptoms. Take it it with food to avoid GI upset. It can cause increased energy but on the other hand it can make it hard to sleep at night so please take it AT [place], it takes 8-12 hours to start working so it will NOT affect your sleeping if you take it at night with your food!!  If you are diabetic it will increase your sugars so decrease carbs and monitor your sugars closely.    Can do a steroid nasal spary 1-2 sparys at night each nostril. Remember to spray each nostril twice towards the outer part of your eye.  Do not sniff but instead pinch your nose and tilt your head back to help the medicine get into your sinuses.  The best time to do this is at bedtime. Stop if you get blurred vision or nose bleeds.   HOW TO TREAT VIRAL COUGH AND COLD SYMPTOMS:  -Symptoms usually last at least 1 week with the worst symptoms being around day 4.  - colds usually start with a sore throat and end with a cough, and the cough can take 2 weeks to get better.  -No antibiotics are needed for colds, flu, sore throats, cough, bronchitis UNLESS symptoms are longer than 7 days OR if you are getting better then get drastically worse.  -There are a lot of combination medications (Dayquil, Nyquil, Vicks 44, tyelnol cold and sinus, ETC). Please look at the ingredients on the back so that you are treating the correct symptoms and not doubling up on medications/ingredients.    Medicines you can use  Nasal congestion  Little Remedies saline spray (aerosol/mist)- can try this, it is in the kids section - pseudoephedrine (Sudafed)- behind the counter, do not use if you have high blood pressure,  medicine that have -D in them.  - phenylephrine (Sudafed PE) -Dextormethorphan + chlorpheniramine (Coridcidin HBP)- okay if you have high blood pressure -Oxymetazoline (Afrin) nasal spray- LIMIT to 3 days -Saline nasal spray -Neti pot (used distilled or bottled water)  Ear pain/congestion  -pseudoephedrine (sudafed) - Nasonex/flonase nasal spray  Fever  -Acetaminophen (Tyelnol) -Ibuprofen (Advil, motrin, aleve)  Sore Throat  -Acetaminophen (Tyelnol) -Ibuprofen (Advil, motrin, aleve) -Drink a lot of water -Gargle with salt water - Rest your voice (don't talk) -Throat sprays -Cough drops  Body Aches  -Acetaminophen (Tyelnol) -Ibuprofen (Advil, motrin, aleve)  Headache  -Acetaminophen (Tyelnol) -Ibuprofen (Advil, motrin, aleve) - Exedrin, Exedrin Migraine  Allergy symptoms (cough, sneeze, runny nose, itchy eyes) -Claritin or loratadine cheapest but likely the weakest  -Zyrtec or certizine at night because it can make you sleepy -The strongest is allegra or fexafinadine  Cheapest at [employer], [employer], [employer]  Cough  -Dextromethorphan (Delsym)- medicine that has DM in it -Guafenesin (Mucinex/Robitussin) - cough drops - drink lots of water  Chest Congestion  -Guafenesin (Mucinex/Robitussin)  Red Itchy Eyes  - Naphcon-A  Upset Stomach  - Bland diet (nothing spicy, greasy, fried, and high acid foods like tomatoes, oranges, berries) -OKAY- cereal, bread, soup, crackers, rice -Eat smaller more frequent meals -reduce caffeine, no alcohol -Loperamide (Imodium-AD) if diarrhea -Prevacid for heart burn  General health  when sick  -Hydration -wash your hands frequently -keep surfaces clean -change pillow cases and sheets often -Get fresh air but do not exercise strenuously -Vitamin D, double up on it - Vitamin C -Zinc

## 2018-02-05 NOTE — Progress Notes (Signed)
Subjective:    Patient ID: Kathryn Larsen, female    DOB: 12-Sep-1970, 47 y.o.   MRN: [id number]  HPI 47 y.o. WF presents with URI symptoms x Tuesday.  No fever, chills.  She is on allegra, alk + cold, mucinex liquid cold day and night.   She has been on phentermine and states that she feels that she has built a tolerance to it. We have discussed wellbutrin in the past for weight loss and she is interested in using it.    Blood pressure 132/74, pulse 84, temperature (!) 97.4 F (36.3 C), resp. rate 16, height _0  (1.676 m), weight 193 lb (87.5 kg), SpO2 98 %.  Medications Current Outpatient Medications on File Prior to Visit  Medication Sig  . aspirin EC 81 MG tablet Take 81 mg by mouth daily.  . Cholecalciferol (VITAMIN D PO) Take by mouth daily.  . Cyanocobalamin (B-12 SL) Place under the tongue.  . fexofenadine (ALLEGRA) 180 MG tablet Take 180 mg by mouth daily.  Marland Kitchen ibuprofen (ADVIL,MOTRIN) 200 MG tablet Take 200 mg by mouth every 6 (six) hours as needed for mild pain.  . phentermine (ADIPEX-P) 37.5 MG tablet Take 1 tablet (37.5 mg total) by mouth daily before breakfast.  . Red Yeast Rice Extract (RED YEAST RICE PO) Take by mouth 2 (two) times daily.   No current facility-administered medications on file prior to visit.     Problem list She has Acid reflux; Eczema; and PFO (patent foramen ovale) on their problem list.  Review of Systems  Constitutional: Negative for chills, diaphoresis and fever.  HENT: Positive for sinus pressure and sneezing. Negative for congestion, ear pain, postnasal drip and sore throat.   Respiratory: Positive for cough. Negative for chest tightness, shortness of breath and wheezing.   Cardiovascular: Negative.   Gastrointestinal: Negative.   Genitourinary: Negative.   Musculoskeletal: Negative for neck pain.  Neurological: Positive for headaches.       Objective:   Physical Exam  Constitutional: She is oriented to person, place, and time.  She appears well-developed and well-nourished.  HENT:  Right Ear: Hearing and external ear normal. No mastoid tenderness. Tympanic membrane is injected. Tympanic membrane is not perforated, not erythematous, not retracted and not bulging. A middle ear effusion is present.  Left Ear: Hearing and external ear normal. No mastoid tenderness. Tympanic membrane is injected. Tympanic membrane is not perforated, not erythematous, not retracted and not bulging. A middle ear effusion is present.  Nose: Right sinus exhibits maxillary sinus tenderness. Left sinus exhibits maxillary sinus tenderness.  Mouth/Throat: Uvula is midline, oropharynx is clear and moist and mucous membranes are normal.  Eyes: Pupils are equal, round, and reactive to light. Conjunctivae and EOM are normal.  Neck: Neck supple.  Cardiovascular: Normal rate and regular rhythm.  Pulmonary/Chest: Effort normal and breath sounds normal. No respiratory distress. She has no wheezes.  Abdominal: Soft. Bowel sounds are normal.  Musculoskeletal: Normal range of motion.  Lymphadenopathy:    She has no cervical adenopathy.  Neurological: She is alert and oriented to person, place, and time.  Skin: Skin is warm and dry.      Assessment & Plan:   Viral URI with cough Will hold the zpak and take if she is not getting better, increase fluids, rest, cont allergy pill -     predniSONE (DELTASONE) 20 MG tablet; 2 tablets daily for 3 days, 1 tablet daily for 4 days.  BMI 31.0-31.9,adult -  buPROPion (WELLBUTRIN XL) 150 MG 24 hr tablet; Take 1 tablet (150 mg total) by mouth every morning. - can do phentermine PRN

## 2018-03-05 MED FILL — buPROPion HCL ER (XL) 150 M: 150 | 30 days supply | Qty: 30 | Fill #1

## 2018-03-05 MED FILL — PHENTERMINE 37.5 MG TABLET: 37.5 | 30 days supply | Qty: 30 | Fill #2

## 2018-03-27 NOTE — Progress Notes (Signed)
47 y.o.female presents for a follow up after being on phentermine for weight loss.  While on the medication they have lost 9 lbs since last visit. They deny palpitations, anxiety, trouble sleeping, elevated BP. She is on wellbutrin and not having siding effects.   BP Readings from Last 3 Encounters:  03/28/18 122/80  02/05/18 132/74  01/24/18 128/76   BMI is Body mass index is 30.47 kg/m., she is working on diet and exercise. Wt Readings from Last 5 Encounters:  03/28/18 188 lb 12.8 oz (85.6 kg)  02/05/18 193 lb (87.5 kg)  01/24/18 195 lb (88.5 kg)  12/19/17 197 lb 3.2 oz (89.4 kg)  07/24/17 193 lb (87.5 kg)    Medications: Current Outpatient Medications on File Prior to Visit  Medication Sig Dispense Refill  . aspirin EC 81 MG tablet Take 81 mg by mouth daily.    Marland Kitchen buPROPion (WELLBUTRIN XL) 150 MG 24 hr tablet Take 1 tablet (150 mg total) by mouth every morning. 30 tablet 2  . Cholecalciferol (VITAMIN D PO) Take by mouth daily.    . Cyanocobalamin (B-12 SL) Place under the tongue.    . fexofenadine (ALLEGRA) 180 MG tablet Take 180 mg by mouth daily.    Marland Kitchen ibuprofen (ADVIL,MOTRIN) 200 MG tablet Take 200 mg by mouth every 6 (six) hours as needed for mild pain.    . phentermine (ADIPEX-P) 37.5 MG tablet Take 1 tablet (37.5 mg total) by mouth daily before breakfast. 30 tablet 2  . Red Yeast Rice Extract (RED YEAST RICE PO) Take by mouth 2 (two) times daily.    . predniSONE (DELTASONE) 20 MG tablet 2 tablets daily for 3 days, 1 tablet daily for 4 days. (Patient not taking: Reported on 03/28/2018) 10 tablet 0   No current facility-administered medications on file prior to visit.     ROS: All negative except for above  Physical exam: Vitals:   03/28/18 0925  BP: 122/80  Pulse: 78  Temp: 98.4 F (36.9 C)  SpO2: 98%   Physical Exam Constitutional:      Appearance: She is well-developed.  HENT:     Head: Normocephalic and atraumatic.     Right Ear: External ear normal.   Left Ear: External ear normal.  Eyes:     Conjunctiva/sclera: Conjunctivae normal.     Pupils: Pupils are equal, round, and reactive to light.  Neck:     Musculoskeletal: Normal range of motion and neck supple.     Thyroid: No thyromegaly.  Cardiovascular:     Rate and Rhythm: Normal rate and regular rhythm.     Heart sounds: Normal heart sounds. No murmur. No friction rub. No gallop.   Pulmonary:     Effort: Pulmonary effort is normal. No respiratory distress.     Breath sounds: Normal breath sounds. No wheezing.  Abdominal:     General: Bowel sounds are normal. There is no distension.     Palpations: Abdomen is soft. There is no mass.     Tenderness: There is no abdominal tenderness. There is no guarding or rebound.  Musculoskeletal: Normal range of motion.  Lymphadenopathy:     Cervical: No cervical adenopathy.  Skin:    General: Skin is warm and dry.  Neurological:     Mental Status: She is alert and oriented to person, place, and time.     Cranial Nerves: No cranial nerve deficit.     Coordination: Coordination normal.     Deep Tendon Reflexes: Reflexes normal.  Assessment: Obesity with co morbid conditions.   Plan: General weight loss/lifestyle modification strategies discussed (elicit support from others; identify saboteurs; non-food rewards, etc). Continue food diary Continue restricted calorie diet Continue daily exercise as well as behavior modification such as walking further away, putting down the fork, having a plan, using stairs, etc.  Medication: phentermine  Follow up in 2 months Future Appointments  Date Time Provider [place]  07/17/2018  2:00 PM Vicie Mutters, PA-C [facility] None  07/26/2018 12:00 PM Fontaine, Belinda Block, MD [facility]

## 2018-03-28 ENCOUNTER — Ambulatory Visit (INDEPENDENT_AMBULATORY_CARE_PROVIDER_SITE_OTHER): Payer: No Typology Code available for payment source | Admitting: Physician Assistant

## 2018-03-28 ENCOUNTER — Encounter: Payer: Self-pay | Admitting: Physician Assistant

## 2018-03-28 VITALS — BP 122/80 | HR 78 | Temp 98.4°F | Ht 66.0 in | Wt 188.8 lb

## 2018-03-28 DIAGNOSIS — Z79899 Other long term (current) drug therapy: Secondary | ICD-10-CM | POA: Diagnosis not present

## 2018-03-28 DIAGNOSIS — Z6831 Body mass index (BMI) 31.0-31.9, adult: Secondary | ICD-10-CM

## 2018-03-28 MED ORDER — BUPROPION HCL ER (XL) 150 MG PO TB24: 150.0000 mg | ORAL_TABLET | ORAL | 1 refills | Status: DC

## 2018-03-28 MED FILL — buPROPion HCL ER (XL) 150 M: 150 | 90 days supply | Qty: 90 | Fill #0

## 2018-03-28 NOTE — Patient Instructions (Signed)
Check out  Mini habits for weight loss book  2 apps for tracking food is myfitness pal  loseit OR can take picture of your food   Google mindful eating and here are some tips and tricks below.   Rate your hunger before you eat on a scale of 1-10, try to eat closer to a 6 or higher. And if you are at below that, why are you eating? Slow down and listen to your body.       SMART goals Having a solid fitness goal is an amazing way to power you towards success, but not all goals are created equal. While it's great to have an end-game in mind, there are some best practices when it comes to goal setting. Whether you want to lose weight, improve your fitness level, or train for an event, putting the SMART method into action can help you achieve what you set out to do.  SMART stands for specific, measurable, attainable, relevant, and timely-all of which are important in reaching a fitness objective. SMART goals can help keep you on track and remind you of your priorities, so you're able to follow through with every workout or healthy meal you have planned.   Get SMART and put these five elements into action when you're setting your fitness goal.  1. Specific  You need something that's not too arbitrary. For example, a bad goal would be, say, 'get healthy'. A specific goal would be to lose weight. You'll narrow down that goal even further by using the rest of the method, but whether you want to get stronger, faster, or smaller, having a baseline points you in the right direction.  2. Measurable  Here's where you determine exactly how you'll measure your goal. If you're going to follow the bad goal, it would be get really healthy.That's not quantifiable. A measurable goal would be, say, 'lose 10 pounds'. You can quantify your progress, and you can sort of back into a time frame once you have that. Your goal may be to master a pull-up, run five miles, or go to the gym four days a week-whatever it  is, you should have a definite way of knowing when you've reached your goal.  3. Attainable  While it can be helpful to set big-picture goals in the long-term, you need a more achievable goal on the horizon to keep you on track. You want to start small and see early wins, which encourages long-term consistency.  If you set something too lofty right off the bat, it might be discouraging to not make progress as fast as you would like. You should also consider the size of your goal-for example, a goal of losing 30 pounds in one month just isn't going to happen, so you're better off setting smaller goals that are in closer reach.  4. Relevant  This is where things get a little tricky, finding your "why" is easier said than done. Ask yourself, 'is this goal worthwhile, and am I motivated to do it?' Creating a goal with some type of motivation attached to it, like I want to lose 10 pounds in two months to be ready for my wedding, can give a bit of relevancy to your goal. Whether you want to feel confident at a big event or perform better during everyday activities, pinpoint why a goal is important to you.  5. Timely  You want to be strict about a deadline-doing so creates urgency. It's also important not to set your  sights too far out. If you give yourself four months to lose 10 pounds, that might be too long because you aren't incentivized to start working at it immediately. Instead, consider setting smaller goals along the way, like "I want to lose three pounds in two weeks." Maybe running a marathon is your long-term goal, but if you've never been a runner, signing up for one that's a month away isn't realistic-instead, set smaller mileage goals for shorter time periods and work your way up.  You should also be honest with yourself about what you're able to accomplish in a given time frame. You just need to adjust your expectations so they're in line with your schedule and commitments.  Once you have  your goal in place, it's all about the follow-through. Whether you want to lose one pound a week, be able to do five full push-ups in two weeks, or run a 5K in under 30 minutes in four weeks, you can come up with a plan to help get your where you want to go-but it all starts with deciding what you want. Be accountable to yourself, stay consistent, and the results will follow.  So try the exercise at home and at your next visit come in with your SMART goal and we can discuss how together we can achieve this goal!

## 2018-05-28 NOTE — Progress Notes (Signed)
48 y.o.female presents for a follow up after being on phentermine for weight loss.  While on the medication they have lost 9 lbs since last visit. They deny palpitations, anxiety, trouble sleeping, elevated BP. She is on wellbutrin and not having siding effects. She has been taking phentermine last few weeks due to stress.   She will finish class and has one more day of clinicals. Wants to shadow here.   BP Readings from Last 3 Encounters:  03/28/18 122/80  02/05/18 132/74  01/24/18 128/76   BMI is Body mass index is 30.28 kg/m., she is working on diet and exercise. Wt Readings from Last 5 Encounters:  05/29/18 187 lb 9.6 oz (85.1 kg)  03/28/18 188 lb 12.8 oz (85.6 kg)  02/05/18 193 lb (87.5 kg)  01/24/18 195 lb (88.5 kg)  12/19/17 197 lb 3.2 oz (89.4 kg)    Medications: Current Outpatient Medications on File Prior to Visit  Medication Sig Dispense Refill  . aspirin EC 81 MG tablet Take 81 mg by mouth daily.    Marland Kitchen buPROPion (WELLBUTRIN XL) 150 MG 24 hr tablet Take 1 tablet (150 mg total) by mouth every morning. 90 tablet 1  . Cholecalciferol (VITAMIN D PO) Take by mouth daily.    . Cyanocobalamin (B-12 SL) Place under the tongue.    . fexofenadine (ALLEGRA) 180 MG tablet Take 180 mg by mouth daily.    Marland Kitchen ibuprofen (ADVIL,MOTRIN) 200 MG tablet Take 200 mg by mouth every 6 (six) hours as needed for mild pain.    . phentermine (ADIPEX-P) 37.5 MG tablet Take 1 tablet (37.5 mg total) by mouth daily before breakfast. 30 tablet 2  . Red Yeast Rice Extract (RED YEAST RICE PO) Take by mouth 2 (two) times daily.     No current facility-administered medications on file prior to visit.     ROS: All negative except for above  Height 5\' 6"  (1.676 m), weight 187 lb 9.6 oz (85.1 kg).   Physical exam: Physical Exam Constitutional:      Appearance: She is well-developed.  HENT:     Head: Normocephalic and atraumatic.     Right Ear: External ear normal.     Left Ear: External ear normal.   Eyes:     Conjunctiva/sclera: Conjunctivae normal.     Pupils: Pupils are equal, round, and reactive to light.  Neck:     Musculoskeletal: Normal range of motion and neck supple.     Thyroid: No thyromegaly.  Cardiovascular:     Rate and Rhythm: Normal rate and regular rhythm.     Heart sounds: Normal heart sounds. No murmur. No friction rub. No gallop.   Pulmonary:     Effort: Pulmonary effort is normal. No respiratory distress.     Breath sounds: Normal breath sounds. No wheezing.  Abdominal:     General: Bowel sounds are normal. There is no distension.     Palpations: Abdomen is soft. There is no mass.     Tenderness: There is no abdominal tenderness. There is no guarding or rebound.  Musculoskeletal: Normal range of motion.  Lymphadenopathy:     Cervical: No cervical adenopathy.  Skin:    General: Skin is warm and dry.  Neurological:     Mental Status: She is alert and oriented to person, place, and time.     Cranial Nerves: No cranial nerve deficit.     Coordination: Coordination normal.     Deep Tendon Reflexes: Reflexes normal.  Assessment: Obesity with co morbid conditions.   Plan: General weight loss/lifestyle modification strategies discussed (elicit support from others; identify saboteurs; non-food rewards, etc). Continue food diary Continue restricted calorie diet Continue daily exercise as well as behavior modification such as walking further away, putting down the fork, having a plan, using stairs, etc.  Medication: phentermine  Follow up in 2 months Future Appointments  Date Time Provider [place]  06/18/2018  5:00 PM [facility]  07/17/2018  2:00 PM Vicie Mutters, PA-C [facility] None  07/26/2018 12:00 PM Fontaine, Belinda Block, MD [facility]

## 2018-05-29 ENCOUNTER — Ambulatory Visit (INDEPENDENT_AMBULATORY_CARE_PROVIDER_SITE_OTHER): Payer: No Typology Code available for payment source | Admitting: Physician Assistant

## 2018-05-29 ENCOUNTER — Encounter: Payer: Self-pay | Admitting: Physician Assistant

## 2018-05-29 VITALS — BP 122/86 | HR 75 | Temp 98.8°F | Ht 66.0 in | Wt 187.6 lb

## 2018-05-29 DIAGNOSIS — Z79899 Other long term (current) drug therapy: Secondary | ICD-10-CM | POA: Diagnosis not present

## 2018-05-29 DIAGNOSIS — Z6831 Body mass index (BMI) 31.0-31.9, adult: Secondary | ICD-10-CM | POA: Diagnosis not present

## 2018-05-29 MED ORDER — PHENTERMINE HCL 37.5 MG PO TABS: 37.5000 mg | ORAL_TABLET | Freq: Every day | ORAL | 2 refills | Status: DC

## 2018-05-29 MED FILL — PHENTERMINE 37.5 MG TABLET: 37.5 | 30 days supply | Qty: 30 | Fill #0

## 2018-05-29 NOTE — Patient Instructions (Addendum)
WATER IS IMPORTANT  Being dehydrated can hurt your kidneys, cause fatigue, headaches, muscle aches, joint pain, and dry skin/nails so please increase your fluids.   Drink 80-100 oz a day of water, measure it out! Eat 3 meals a day, have to do breakfast, eat protein- hard boiled eggs, protein bar like nature [place] protein bar, greek yogurt like oikos triple zero, chobani 100, or light n fit greek  Can check out plantnanny app on your phone to help you keep track of your water    Drink 1/2 your body weight in fluid ounces of water daily; drink a tall glass of water 30 min before meals  Don't eat until you're stuffed- listen to your stomach and eat until you are 80% full   Try eating off of a salad plate; wait 10 min after finishing before going back for seconds  Start by eating the vegetables on your plate; aim for 50% of your meals to be fruits or vegetables  Then eat your protein - lean meats (grass fed if possible), fish, beans, nuts in moderation  Eat your carbs/starch last ONLY if you still are hungry. If you can, stop before finishing it all  Avoid sugar and flour - the closer it looks to it's original form in nature, typically the better it is for you  Splurge in moderation - "assign" days when you get to splurge and have the "bad stuff" - I like to follow a 80% - 20% plan- "good" choices 80 % of the time, "bad" choices in moderation 20% of the time  Simple equation is: Calories out > calories in = weight loss - even if you eat the bad stuff, if you limit portions, you will still lose weight   Google mindful eating and here are some tips and tricks below.   Rate your hunger before you eat on a scale of 1-10, try to eat closer to a 6 or higher. And if you are at below that, why are you eating? Slow down and listen to your body.

## 2018-06-05 MED FILL — OSELTAMIVIR PHOSPHATE 75 MG: 75 | 10 days supply | Qty: 10 | Fill #0

## 2018-06-12 ENCOUNTER — Other Ambulatory Visit: Payer: Self-pay | Admitting: Physician Assistant

## 2018-06-12 DIAGNOSIS — Z1231 Encounter for screening mammogram for malignant neoplasm of breast: Secondary | ICD-10-CM

## 2018-06-18 ENCOUNTER — Ambulatory Visit
Admission: RE | Admit: 2018-06-18 | Discharge: 2018-06-18 | Disposition: A | Discharge status: Home / Self Care | Payer: No Typology Code available for payment source | Source: Ambulatory Visit

## 2018-06-18 DIAGNOSIS — Z1231 Encounter for screening mammogram for malignant neoplasm of breast: Secondary | ICD-10-CM

## 2018-06-22 MED FILL — buPROPion HCL ER (XL) 150 M: 150 | 30 days supply | Qty: 30 | Fill #2

## 2018-07-05 ENCOUNTER — Telehealth: Payer: No Typology Code available for payment source | Admitting: Physician Assistant

## 2018-07-05 ENCOUNTER — Other Ambulatory Visit: Payer: Self-pay | Admitting: Physician Assistant

## 2018-07-05 DIAGNOSIS — R05 Cough: Secondary | ICD-10-CM

## 2018-07-05 DIAGNOSIS — R059 Cough, unspecified: Secondary | ICD-10-CM

## 2018-07-05 MED ORDER — AZITHROMYCIN 250 MG PO TABS: ORAL_TABLET | ORAL | 1 refills | Status: DC

## 2018-07-05 MED ORDER — BENZONATATE 200 MG PO CAPS: 200.0000 mg | ORAL_CAPSULE | Freq: Three times a day (TID) | ORAL | 0 refills | Status: DC | PRN

## 2018-07-05 MED ORDER — DOXYCYCLINE HYCLATE 100 MG PO CAPS: ORAL_CAPSULE | ORAL | 0 refills | Status: DC

## 2018-07-05 MED FILL — DOXYCYCLINE HYCLATE 100 MG: 100 | 10 days supply | Qty: 20 | Fill #0

## 2018-07-05 NOTE — Telephone Encounter (Signed)
HIS ENCOUNTER IS A VIRTUAL VISIT DUE TO COVID-19 - PATIENT WAS NOT SEEN IN THE OFFICE. PATIENT HAS CONSENTED TO VIRTUAL VISIT / TELEMEDICINE VISIT  Virtual Visit via Video Note  I connected with Kathryn Larsen on 07/05/18 by a video enabled telemedicine application and verified that I am speaking with the correct person using two identifiers.   I discussed the limitations of evaluation and management by telemedicine and the availability of in person appointments. The patient expressed understanding and agreed to proceed.  History of Present Illness: Patient has had sinus issues/cough x 1 week but last 2-3 days has had dry cough with some SOB with stairs that is new. 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 She is on tylenol so has not had a fever, last temp was 97.6 yesterday.  No chest pain.  They have cough med at home, declines albuterol at this time. Will send in a zpak to hold on to. Will get on flonase.  Understands self isolations precautions. Discussed on phone Info sent via mychart to patient  Medications    Current Outpatient Medications (Respiratory):  .  fexofenadine (ALLEGRA) 180 MG tablet, Take 180 mg by mouth daily.  Current Outpatient Medications (Analgesics):  .  aspirin EC 81 MG tablet, Take 81 mg by mouth daily. Marland Kitchen  ibuprofen (ADVIL,MOTRIN) 200 MG tablet, Take 200 mg by mouth every 6 (six) hours as needed for mild pain.  Current Outpatient Medications (Hematological):  Marland Kitchen  Cyanocobalamin (B-12 SL), Place under the tongue.  Current Outpatient Medications (Other):  Marland Kitchen  buPROPion (WELLBUTRIN XL) 150 MG 24 hr tablet, Take 1 tablet (150 mg total) by mouth every morning. .  Cholecalciferol (VITAMIN D PO), Take by mouth daily. .  phentermine (ADIPEX-P) 37.5 MG tablet, Take 1 tablet (37.5 mg total) by mouth daily before breakfast. .  Red Yeast Rice Extract (RED YEAST RICE PO), Take by mouth 2 (two) times daily.  Problem list She has Acid reflux; Eczema; and  PFO (patent foramen ovale) on their problem list.   Observations/Objective: General Appearance: Well nourished well developed, in no apparent distress.  Eyes: conjunctiva no swelling or erythema ENT/Mouth: No hoarseness, No cough for duration of visit.  Neck: Supple  Respiratory: Respiratory effort normal, normal rate, no retractions or distress.   Cardio: Appears well-perfused, noncyanotic Musculoskeletal: no obvious deformity Skin: visible skin without rashes, ecchymosis, erythema Neuro: Awake and oriented X 3,  Psych:  normal affect, Insight and Judgment appropriate.   Assessment and Plan: URI versus COVID Currently mild symptoms, and low risk patient.  Suggested symptomatic OTC remedies. Nasal saline spray for congestion. Nasal steroids, allergy pill, staying hydrated Follow up as needed via mychart or text.. Will do self isolation at home Patient understands will not break quarantine without my permission.   Follow Up Instructions:    I discussed the assessment and treatment plan with the patient. The patient was provided an opportunity to ask questions and all were answered. The patient agreed with the plan and demonstrated an understanding of the instructions.   The patient was advised to call back or seek an in-person evaluation if the symptoms worsen or if the condition fails to improve as anticipated.  I provided 20-30 minutes of non-face-to-face time during this encounter.   Vicie Mutters, PA-C

## 2018-07-11 MED FILL — PHENTERMINE 37.5 MG TABLET: 37.5 | 30 days supply | Qty: 30 | Fill #1

## 2018-07-16 NOTE — Progress Notes (Deleted)
Complete Physical  Assessment and Plan:  Routine general medical examination at a health care facility  PFO (patent foramen ovale) Will monitor  Gastroesophageal reflux disease without esophagitis Continue PPI/H2 blocker, diet discussed  Eczema, unspecified type monitor  Medication management -     CBC with Differential/Platelet -     BASIC METABOLIC PANEL WITH GFR -     Hepatic function panel -     Magnesium  Vitamin D deficiency -     VITAMIN D 25 Hydroxy (Vit-D Deficiency, Fractures)  Need for diphtheria-tetanus-pertussis (Tdap) vaccine -     Tdap vaccine greater than or equal to 7yo IM  Class 1 obesity due to excess calories with serious comorbidity and body mass index (BMI) of 31.0 to 31.9 in adult - long discussion about weight loss, diet, and exercise  Screening cholesterol level -     Lipid panel  Screening for blood or protein in urine -     Urinalysis, Routine w reflex microscopic -     Microalbumin / creatinine urine ratio   Discussed med's effects and SE's. Screening labs and tests as requested with regular follow-up as recommended. Over 40 minutes of exam, counseling, chart review, and complex, high level critical decision making was performed this visit.   HPI  48 y.o. female  presents for a complete physical and follow up for has Acid reflux; Eczema; and PFO (patent foramen ovale) on their problem list..  Her blood pressure has been controlled at home, today their BP is   She does workout, trying to with work and school. She denies chest pain, shortness of breath, dizziness.  Going to [employer], some increased stress, feels that she is struggling with weight loss. Took a break and has started clinicals. She still has some hot flashes, some night or day flashes.    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  BMI is There is no height or weight on file to calculate BMI., she is working on  diet and exercise. Wt Readings from Last 3 Encounters:  05/29/18 187 lb 9.6 oz (85.1 kg)  03/28/18 188 lb 12.8 oz (85.6 kg)  02/05/18 193 lb (87.5 kg)    She is not on cholesterol medication and denies myalgias. Her cholesterol is at goal. The cholesterol last visit was:   Lab Results  Component Value Date   CHOL 220 (H) 12/19/2017   HDL 45 (L) 12/19/2017   LDLCALC 135 (H) 12/19/2017   TRIG 257 (H) 12/19/2017   CHOLHDL 4.9 12/19/2017   Patient is on Vitamin D supplement.   Lab Results  Component Value Date   VD25OH 33 07/11/2016     BMI is There is no height or weight on file to calculate BMI., she is working on diet and exercise. Wt Readings from Last 3 Encounters:  05/29/18 187 lb 9.6 oz (85.1 kg)  03/28/18 188 lb 12.8 oz (85.6 kg)  02/05/18 193 lb (87.5 kg)   Current Medications:  Current Outpatient Medications on File Prior to Visit  Medication Sig Dispense Refill  . aspirin EC 81 MG tablet Take 81 mg by mouth daily.    . benzonatate (TESSALON) 200 MG capsule Take 1 capsule (200 mg total) by mouth 3 (three) times daily as needed for cough (Max: 600mg  per day). 30 capsule 0  . buPROPion (WELLBUTRIN XL) 150 MG 24 hr tablet Take 1 tablet (150 mg total) by mouth every morning.  90 tablet 1  . Cholecalciferol (VITAMIN D PO) Take by mouth daily.    . Cyanocobalamin (B-12 SL) Place under the tongue.    Marland Kitchen doxycycline (VIBRAMYCIN) 100 MG capsule Take 1 capsule twice daily with food 20 capsule 0  . fexofenadine (ALLEGRA) 180 MG tablet Take 180 mg by mouth daily.    Marland Kitchen ibuprofen (ADVIL,MOTRIN) 200 MG tablet Take 200 mg by mouth every 6 (six) hours as needed for mild pain.    . phentermine (ADIPEX-P) 37.5 MG tablet Take 1 tablet (37.5 mg total) by mouth daily before breakfast. 30 tablet 2  . Red Yeast Rice Extract (RED YEAST RICE PO) Take by mouth 2 (two) times daily.     No current facility-administered medications on file prior to visit.    Allergies:  No Known Allergies    Medical History:  She has Acid reflux; Eczema; and PFO (patent foramen ovale) on their problem list.   Health Maintenance:   Immunization History  Administered Date(s) Administered  . Influenza Inj Mdck Quad With Preservative 12/29/2016  . Influenza Split 01/31/2011  . PPD Test 12/29/2016, 12/19/2017  . Tdap 07/11/2016    Tetanus: 2018 Pneumovax: N/A Prevnar 13: N/A Flu vaccine: 2018 Zostavax: N/A  LMP: s/p hysterectomy Pap: 2018- had high risk HPV until last year MGM: 05/2016 Has OV on the 23rd DEXA:N/A Colonoscopy: N/A EGD: N/A  Last Dental Exam: Dr. ziggler Last Eye Exam: Dr. Katy Fitch- going to see him, has OV next week. .  Patient Care Team: Vicie Mutters, PA-C as PCP - General (Physician Assistant)  Surgical History:  She has a past surgical history that includes Cesarean section; Endometrial ablation (04/2005); Colposcopy; Dental surgery; doppler echocardiography; Upper gi endoscopy; Dilatation & curettage/hysteroscopy with myosure (N/A, 07/07/2015); Laparoscopic vaginal hysterectomy with salpingectomy (Bilateral, 09/15/2015); and Cystoscopy (09/15/2015). Family History:  Herfamily history includes COPD in her father; Diabetes in her paternal grandmother; Heart disease in her paternal grandmother; Hyperlipidemia in her mother; Hypertension in her mother and paternal grandmother; Myopathy in her mother. Social History:  She reports that she quit smoking about 13 years ago. Her smoking use included cigarettes. She has a 30.00 pack-year smoking history. She has never used smokeless tobacco. She reports current alcohol use of about 1.0 standard drinks of alcohol per week. She reports that she does not use drugs.  Review of Systems: Review of Systems  Constitutional: Positive for malaise/fatigue. Negative for chills, diaphoresis, fever and weight loss.  HENT: Negative.   Eyes: Negative.   Respiratory: Negative.   Cardiovascular: Negative.   Gastrointestinal: Positive for  heartburn. Negative for abdominal pain, blood in stool, constipation, diarrhea, melena, nausea and vomiting.  Genitourinary: Negative.        Vaginal dryness  Musculoskeletal: Negative.   Skin: Negative.   Neurological: Negative for weakness.    Physical Exam: Estimated body mass index is 30.28 kg/m as calculated from the following:   Height as of 05/29/18: 5\' 6"  (1.676 m).   Weight as of 05/29/18: 187 lb 9.6 oz (85.1 kg). There were no vitals taken for this visit. General Appearance: Well nourished, in no apparent distress.  Eyes: PERRLA, EOMs, conjunctiva no swelling or erythema, normal fundi and vessels.  Sinuses: No Frontal/maxillary tenderness  ENT/Mouth: Ext aud canals clear, normal light reflex with TMs without erythema, bulging. Good dentition. No erythema, swelling, or exudate on post pharynx. Tonsils not swollen or erythematous. Hearing normal.  Neck: Supple, thyroid normal. No bruits  Respiratory: Respiratory effort normal, BS equal bilaterally without  rales, rhonchi, wheezing or stridor.  Cardio: RRR without murmurs, rubs or gallops. Brisk peripheral pulses without edema.  Chest: symmetric, with normal excursions and percussion.  Breasts: defer Abdomen: Soft, nontender, no guarding, rebound, hernias, masses, or organomegaly.  Lymphatics: Non tender without lymphadenopathy.  Genitourinary: defer Musculoskeletal: Full ROM all peripheral extremities,5/5 strength, and normal gait.  Skin: Warm, dry without rashes, lesions, ecchymosis. Neuro: Cranial nerves intact, reflexes equal bilaterally. Normal muscle tone, no cerebellar symptoms. Sensation intact.  Psych: Awake and oriented X 3, normal affect, Insight and Judgment appropriate.   EKG: declines AORTA SCAN: declines   Vicie Mutters 1:01 PM [facility]

## 2018-07-17 ENCOUNTER — Encounter: Payer: Self-pay | Admitting: Physician Assistant

## 2018-07-17 ENCOUNTER — Other Ambulatory Visit: Payer: Self-pay

## 2018-07-17 ENCOUNTER — Ambulatory Visit: Payer: PRIVATE HEALTH INSURANCE | Admitting: Physician Assistant

## 2018-07-17 VITALS — BP 116/78 | HR 88 | Temp 97.8°F | Wt 186.6 lb

## 2018-07-17 DIAGNOSIS — G4700 Insomnia, unspecified: Secondary | ICD-10-CM | POA: Diagnosis not present

## 2018-07-17 DIAGNOSIS — E559 Vitamin D deficiency, unspecified: Secondary | ICD-10-CM

## 2018-07-17 DIAGNOSIS — Z6831 Body mass index (BMI) 31.0-31.9, adult: Secondary | ICD-10-CM

## 2018-07-17 DIAGNOSIS — E782 Mixed hyperlipidemia: Secondary | ICD-10-CM

## 2018-07-17 DIAGNOSIS — I10 Essential (primary) hypertension: Secondary | ICD-10-CM

## 2018-07-17 NOTE — Progress Notes (Signed)
THIS ENCOUNTER IS A VIRTUAL VISIT DUE TO COVID-19 - PATIENT WAS NOT SEEN IN THE OFFICE.  PATIENT HAS CONSENTED TO VIRTUAL VISIT / TELEMEDICINE VISIT   Virtual Visit via telephone Note  I connected with Marua Z Mcclory on 07/17/18  by telephone.  I verified that I am speaking with the correct person using two identifiers.    I discussed the limitations of evaluation and management by telemedicine and the availability of in person appointments. The patient expressed understanding and agreed to proceed.  History of Present Illness: Her blood pressure has been controlled at home, today their BP is BP: 116/78  PATIENT IS RN AT [employer], SHE IS FLOATING RIGHT NOW, WORKING IN ICU OCCASIONALLY.  BMI is Body mass index is 30.12 kg/m., she is working on diet and exercise. Wt Readings from Last 3 Encounters:  07/17/18 186 lb 9.6 oz (84.6 kg)  05/29/18 187 lb 9.6 oz (85.1 kg)  03/28/18 188 lb 12.8 oz (85.6 kg)    She does workout. She denies chest pain, shortness of breath, dizziness.  She is not on cholesterol medication, SHE IS ON RYRE and denies myalgias. Her cholesterol is not at goal. The cholesterol last visit was:   Lab Results  Component Value Date   CHOL 220 (H) 12/19/2017   HDL 45 (L) 12/19/2017   LDLCALC 135 (H) 12/19/2017   TRIG 257 (H) 12/19/2017   CHOLHDL 4.9 12/19/2017   Patient is on Vitamin D supplement.   Lab Results  Component Value Date   VD25OH 33 07/11/2016       Medications    Current Outpatient Medications (Respiratory):  .  fexofenadine (ALLEGRA) 180 MG tablet, Take 180 mg by mouth daily.  Current Outpatient Medications (Analgesics):  .  aspirin EC 81 MG tablet, Take 81 mg by mouth daily. Marland Kitchen  ibuprofen (ADVIL,MOTRIN) 200 MG tablet, Take 200 mg by mouth every 6 (six) hours as needed for mild pain.  Current Outpatient Medications (Hematological):  Marland Kitchen  Cyanocobalamin (B-12 SL), Place under the tongue.  Current Outpatient Medications (Other):  Marland Kitchen   Cholecalciferol (VITAMIN D PO), Take by mouth daily. Marland Kitchen  doxycycline (VIBRAMYCIN) 100 MG capsule, Take 1 capsule twice daily with food .  Red Yeast Rice Extract (RED YEAST RICE PO), Take by mouth 2 (two) times daily. .  phentermine (ADIPEX-P) 37.5 MG tablet, Take 1 tablet (37.5 mg total) by mouth daily before breakfast. (Patient not taking: Reported on 07/17/2018)  Problem list She has Acid reflux; Eczema; and PFO (patent foramen ovale) on their problem list.   Observations/Objective: General Appearance:Well sounding, in no apparent distress.  ENT/Mouth: No hoarseness, No cough for duration of visit.  Respiratory: completing full sentences without distress, without audible wheeze Neuro: Awake and oriented X 3,  Psych:  Insight and Judgment appropriate.    Assessment and Plan:  Mixed hyperlipidemia CONTINUE RYRE FOR NOW WILL NEED TO RECHECK CHOLESTEROL IN THE FUTURE  Essential hypertension - continue medications, DASH diet, exercise and monitor at home. Call if greater than 130/80.   Vitamin D deficiency CONTINUE SUPPLEMENT  Insomnia, unspecified type good sleep hygiene discussed  increase day time activity  try melatonin or UNISOM if this does not help we will call in sleep medication TRY HEADSPACE MEDITATION APP.   BMI 31.0-31.9,adult CONTINUE PHENTERMINE AS NEEDED  Future Appointments  Date Time Provider [place]  07/26/2018 12:00 PM Fontaine, Belinda Block, MD [id number]  07/22/2019  3:00 PM Vicie Mutters, PA-C [id number] None   Follow  Up Instructions:    I discussed the assessment and treatment plan with the patient. The patient was provided an opportunity to ask questions and all were answered. The patient agreed with the plan and demonstrated an understanding of the instructions.   The patient was advised to call back or seek an in-person evaluation if the symptoms worsen or if the condition fails to improve as anticipated.  I provided 20 minutes of  non-face-to-face time during this encounter.   Vicie Mutters, PA-C

## 2018-07-17 NOTE — Patient Instructions (Signed)
SENT IN MYCHART 

## 2018-07-26 ENCOUNTER — Encounter: Payer: 59 | Admitting: Gynecology

## 2018-09-06 ENCOUNTER — Encounter: Payer: Self-pay | Admitting: Gynecology

## 2018-09-21 ENCOUNTER — Ambulatory Visit (INDEPENDENT_AMBULATORY_CARE_PROVIDER_SITE_OTHER): Payer: No Typology Code available for payment source | Admitting: Gynecology

## 2018-09-21 ENCOUNTER — Other Ambulatory Visit: Payer: Self-pay

## 2018-09-21 ENCOUNTER — Encounter: Payer: Self-pay | Admitting: Gynecology

## 2018-09-21 VITALS — BP 120/78 | Ht 66.0 in | Wt 191.0 lb

## 2018-09-21 DIAGNOSIS — Z01419 Encounter for gynecological examination (general) (routine) without abnormal findings: Secondary | ICD-10-CM | POA: Diagnosis not present

## 2018-09-21 NOTE — Patient Instructions (Signed)
Follow-up in 1 year for annual exam, sooner as needed. 

## 2018-09-21 NOTE — Progress Notes (Signed)
    VERDEAN MURIN 01/14/1971 [id number]        48 y.o.  [id number] for annual gynecologic exam.  Without gynecologic complaints  Past medical history,surgical history, problem list, medications, allergies, family history and social history were all reviewed and documented as reviewed in the EPIC chart.  ROS:  Performed with pertinent positives and negatives included in the history, assessment and plan.   Additional significant findings : None   Exam: [name] assistant Vitals:   09/21/18 1200  BP: 120/78  Weight: 191 lb (86.6 kg)  Height: 5\' 6"  (1.676 m)   Body mass index is 30.83 kg/m.  General appearance:  Normal affect, orientation and appearance. Skin: Grossly normal HEENT: Without gross lesions.  No cervical or supraclavicular adenopathy. Thyroid normal.  Lungs:  Clear without wheezing, rales or rhonchi Cardiac: RR, without RMG Abdominal:  Soft, nontender, without masses, guarding, rebound, organomegaly or hernia Breasts:  Examined lying and sitting without masses, retractions, discharge or axillary adenopathy. Pelvic:  Ext, BUS, Vagina: Normal.  Pap smear of vaginal cuff done  Adnexa: Without masses or tenderness    Anus and perineum: Normal   Rectovaginal: Normal sphincter tone without palpated masses or tenderness.    Assessment/Plan:  48 y.o. [id number] female for annual gynecologic exam.   1. Status post LAVH bilateral salpingectomies 2017 for pelvic pain, endometrial polyp, hematometria and persistent LGSIL positive high risk HPV.  Final pathology showed high-grade dysplasia with clear margins.  Pap smear/HPV negative 2018.  Pap smear done today. 2. Mammography in the process of being scheduled.  Breast exam normal today. 3. Health maintenance.  No routine lab work done as patient plans to do this through her primary providers office.  Follow-up 1 year, sooner as needed.   [name] MD, 12:19 PM 09/21/2018

## 2018-09-21 NOTE — Addendum Note (Signed)
Addended by: Nelva Nay on: 09/21/2018 01:18 PM   Modules accepted: Orders

## 2018-09-24 LAB — PAP IG W/ RFLX HPV ASCU

## 2018-09-25 ENCOUNTER — Encounter: Payer: Self-pay | Admitting: Gynecology

## 2018-09-26 MED FILL — PHENTERMINE 37.5 MG TABLET: 37.5 | 30 days supply | Qty: 30 | Fill #2

## 2018-10-15 ENCOUNTER — Encounter: Payer: No Typology Code available for payment source | Admitting: Gynecology

## 2018-10-17 ENCOUNTER — Encounter: Payer: Self-pay | Admitting: Gynecology

## 2018-10-17 ENCOUNTER — Other Ambulatory Visit: Payer: Self-pay

## 2018-10-17 ENCOUNTER — Ambulatory Visit (INDEPENDENT_AMBULATORY_CARE_PROVIDER_SITE_OTHER): Payer: No Typology Code available for payment source | Admitting: Gynecology

## 2018-10-17 VITALS — BP 118/76

## 2018-10-17 DIAGNOSIS — N890 Mild vaginal dysplasia: Secondary | ICD-10-CM | POA: Diagnosis not present

## 2018-10-17 NOTE — Patient Instructions (Signed)
Follow-up for your annual exam when due next year

## 2018-10-17 NOTE — Progress Notes (Signed)
    Kathryn Larsen [birth year] [id number]        48 y.o.  [id number] presents for colposcopy.  Recently had her annual exam where her Pap smear returned LGSIL.  She has a history of LAVH bilateral salpingectomies 2017 for pelvic pain, endometrial polyp and hematometria with persistent LGSIL positive high risk HPV.  Final pathology showed high-grade dysplasia with clear margins.  Pap smear/HPV was negative in 2018  Past medical history,surgical history, problem list, medications, allergies, family history and social history were all reviewed and documented in the EPIC chart.  Directed ROS with pertinent positives and negatives documented in the history of present illness/assessment and plan.  Exam: [name] assistant Vitals:   10/17/18 0927  BP: 118/76   General appearance:  Normal Abdomen soft nontender without masses guarding rebound Pelvic external BUS vagina normal.  Bimanual without masses or tenderness.  Colposcopy was performed of the entire vagina from the vaginal cuff to the introital opening after acetic acid cleanse with no abnormalities seen.  No biopsies taken.  Assessment/Plan:  48 y.o. [id number] with LGSIL on Pap smear.  History of HGSIL and positive high risk HPV in the past.  Colposcopy today was normal noting good visualization into the vaginal cuff region bilaterally.  Recommend follow-up Pap smear next year at her annual exam.    [name] MD, 9:58 AM 10/17/2018

## 2018-11-06 ENCOUNTER — Ambulatory Visit: Payer: No Typology Code available for payment source

## 2019-01-01 ENCOUNTER — Encounter: Payer: Self-pay | Admitting: Gynecology

## 2019-01-23 ENCOUNTER — Encounter: Payer: No Typology Code available for payment source | Admitting: Physician Assistant

## 2019-01-25 NOTE — Progress Notes (Signed)
Complete Physical  Assessment and Plan:  Routine general medical examination at a health care facility 1 YEAR  Gastroesophageal reflux disease without esophagitis Continue PPI/H2 blocker, diet discussed  Eczema, unspecified type Monitor  PFO (patent foramen ovale) Monitor  Essential hypertension -     CBC with Differential/Platelet -     COMPLETE METABOLIC PANEL WITH GFR -     TSH - continue medications, DASH diet, exercise and monitor at home. Call if greater than 130/80.   Mixed hyperlipidemia -     Lipid panel check lipids decrease fatty foods increase activity.   Vitamin D deficiency -     VITAMIN D 25 Hydroxy (Vit-D Deficiency, Fractures)  Medication management -     Magnesium  Screening for blood or protein in urine -     Urinalysis, Routine w reflex microscopic -     Microalbumin / creatinine urine ratio  Screening for thyroid disorder -     TSH  Infected abrasion of left thumb, initial encounter -     doxycycline (VIBRAMYCIN) 100 MG capsule; Take 1 capsule twice daily with food - no obvious abscess to I&D - epson salt soaks and if not better may lance next OV  Overweight -     topiramate (TOPAMAX) 50 MG tablet; Take 1 tablet (50 mg total) by mouth daily. Has failed phentermine, will try topamax - given information about diet - follow up 1 month  Screening for diabetes mellitus -     Hemoglobin A1c  Left SI pain ?piriformis syndrome- do exercises, if not better may do PT/imaging Topamax may help  Discussed med's effects and SE's. Screening labs and tests as requested with regular follow-up as recommended. Over 40 minutes of exam, counseling, chart review, and complex, high level critical decision making was performed this visit.   HPI  48 y.o. female  presents for a complete physical and follow up for has Acid reflux; Eczema; and PFO (patent foramen ovale) on their problem list..  Her blood pressure has been controlled at home, today their BP is  BP: 114/80 She does workout, trying to with work and school. She denies chest pain, shortness of breath, dizziness.  She is left handed, was sanding a cabinet and states her left thumb is irritated and   Has had left SI hot/warm spot intermittently for years, but for several months has had burning ache left buttocks, hurts all day, will occ walk with limp due to pain, sitting will hurt, hurting at night because it is waking her up.   Gradated FNP, going to work [employer] in [place] in Jan.    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  BMI is Body mass index is 31.86 kg/m., she admits to eating a lot of candy.  Wt Readings from Last 3 Encounters:  01/28/19 197 lb 6.4 oz (89.5 kg)  09/21/18 191 lb (86.6 kg)  07/17/18 186 lb 9.6 oz (84.6 kg)    She is not on cholesterol medication and denies myalgias. Her cholesterol is not at goal. The cholesterol last visit was:   Lab Results  Component Value Date   CHOL 202 (H) 01/28/2019   HDL 49 (L) 01/28/2019   LDLCALC 122 (H) 01/28/2019   TRIG 184 (H) 01/28/2019   CHOLHDL 4.1 01/28/2019   Patient is on Vitamin D supplement, 5000 IU.   Lab Results  Component Value Date   VD25OH 56 01/28/2019  Current Medications:  Current Outpatient Medications on File Prior to Visit  Medication Sig Dispense Refill  . aspirin EC 81 MG tablet Take 81 mg by mouth daily.    . Cholecalciferol (VITAMIN D PO) Take by mouth daily.    . Cyanocobalamin (B-12 SL) Place under the tongue.    . fexofenadine (ALLEGRA) 180 MG tablet Take 180 mg by mouth daily.    Marland Kitchen ibuprofen (ADVIL,MOTRIN) 200 MG tablet Take 200 mg by mouth every 6 (six) hours as needed for mild pain.    . Red Yeast Rice Extract (RED YEAST RICE PO) Take by mouth 2 (two) times daily.     No current facility-administered medications on file prior to visit.    Allergies:  No Known Allergies   Medical History:  She has Acid reflux; Eczema; and PFO  (patent foramen ovale) on their problem list.   Health Maintenance:   Immunization History  Administered Date(s) Administered  . Influenza Inj Mdck Quad With Preservative 12/29/2016  . Influenza Split 01/31/2011, 01/27/2019  . PPD Test 12/29/2016, 12/19/2017  . Tdap 07/11/2016    Tetanus: 2018 Pneumovax: N/A Prevnar 13: N/A Flu vaccine: 2020 at work Zostavax: N/A  LMP: s/p hysterectomy Pap: 2018- at GYN, every other year due to HPV for 10 years MGM: 07/2017 DEXA:N/A Colonoscopy: N/A EGD: N/A  Last Dental Exam: Dr. ziggler Last Eye Exam: Dr. Katy Fitch- going to see him, has OV next week. .  Patient Care Team: Vicie Mutters, PA-C as PCP - General (Physician Assistant)  Surgical History:  She has a past surgical history that includes Cesarean section; Endometrial ablation (04/2005); Colposcopy; Dental surgery; doppler echocardiography; Upper gi endoscopy; Dilatation & curettage/hysteroscopy with myosure (N/A, 07/07/2015); Laparoscopic vaginal hysterectomy with salpingectomy (Bilateral, 09/15/2015); and Cystoscopy (09/15/2015). Family History:  Herfamily history includes COPD in her father; Diabetes in her paternal grandmother; Heart disease in her paternal grandmother; Hyperlipidemia in her mother; Hypertension in her mother and paternal grandmother; Myopathy in her mother. Social History:  She reports that she quit smoking about 13 years ago. Her smoking use included cigarettes. She has a 30.00 pack-year smoking history. She has never used smokeless tobacco. She reports current alcohol use of about 1.0 standard drinks of alcohol per week. She reports that she does not use drugs.  Review of Systems: Review of Systems  Constitutional: Positive for malaise/fatigue. Negative for chills, diaphoresis, fever and weight loss.  HENT: Negative.   Eyes: Negative.   Respiratory: Negative.   Cardiovascular: Negative.   Gastrointestinal: Positive for heartburn. Negative for abdominal pain, blood  in stool, constipation, diarrhea, melena, nausea and vomiting.  Genitourinary: Negative.        Vaginal dryness  Musculoskeletal: Negative.   Skin: Negative.   Neurological: Negative for weakness.    Physical Exam: Estimated body mass index is 31.86 kg/m as calculated from the following:   Height as of 09/21/18: 5\' 6"  (1.676 m).   Weight as of this encounter: 197 lb 6.4 oz (89.5 kg). BP 114/80   Pulse 84   Temp 97.7 F (36.5 C)   Wt 197 lb 6.4 oz (89.5 kg)   SpO2 96%   BMI 31.86 kg/m  General Appearance: Well nourished, in no apparent distress.  Eyes: PERRLA, EOMs, conjunctiva no swelling or erythema, normal fundi and vessels.  Sinuses: No Frontal/maxillary tenderness  ENT/Mouth: Ext aud canals clear, normal light reflex with TMs without erythema, bulging. Good dentition. No erythema, swelling, or exudate on post pharynx. Tonsils not swollen or erythematous. Hearing  normal.  Neck: Supple, thyroid normal. No bruits  Respiratory: Respiratory effort normal, BS equal bilaterally without rales, rhonchi, wheezing or stridor.  Cardio: RRR without murmurs, rubs or gallops. Brisk peripheral pulses without edema.  Chest: symmetric, with normal excursions and percussion.  Breasts: defer Abdomen: Soft, nontender, no guarding, rebound, hernias, masses, or organomegaly.  Lymphatics: Non tender without lymphadenopathy.  Genitourinary: defer Musculoskeletal: Full ROM all peripheral extremities,5/5 strength, and normal gait.  Skin: Warm, dry without rashes, lesions, ecchymosis. Neuro: Cranial nerves intact, reflexes equal bilaterally. Normal muscle tone, no cerebellar symptoms. Sensation intact.  Psych: Awake and oriented X 3, normal affect, Insight and Judgment appropriate.   EKG: declines AORTA SCAN: declines   Vicie Mutters 8:27 AM [facility]

## 2019-01-28 ENCOUNTER — Other Ambulatory Visit: Payer: Self-pay

## 2019-01-28 ENCOUNTER — Encounter: Payer: Self-pay | Admitting: Physician Assistant

## 2019-01-28 ENCOUNTER — Ambulatory Visit (INDEPENDENT_AMBULATORY_CARE_PROVIDER_SITE_OTHER): Payer: PRIVATE HEALTH INSURANCE | Admitting: Physician Assistant

## 2019-01-28 VITALS — BP 114/80 | HR 84 | Temp 97.7°F | Wt 197.4 lb

## 2019-01-28 DIAGNOSIS — Z0000 Encounter for general adult medical examination without abnormal findings: Secondary | ICD-10-CM | POA: Diagnosis not present

## 2019-01-28 DIAGNOSIS — E782 Mixed hyperlipidemia: Secondary | ICD-10-CM

## 2019-01-28 DIAGNOSIS — Q2112 Patent foramen ovale: Secondary | ICD-10-CM

## 2019-01-28 DIAGNOSIS — L309 Dermatitis, unspecified: Secondary | ICD-10-CM

## 2019-01-28 DIAGNOSIS — E559 Vitamin D deficiency, unspecified: Secondary | ICD-10-CM

## 2019-01-28 DIAGNOSIS — Z131 Encounter for screening for diabetes mellitus: Secondary | ICD-10-CM | POA: Diagnosis not present

## 2019-01-28 DIAGNOSIS — Z1329 Encounter for screening for other suspected endocrine disorder: Secondary | ICD-10-CM

## 2019-01-28 DIAGNOSIS — Z1389 Encounter for screening for other disorder: Secondary | ICD-10-CM | POA: Diagnosis not present

## 2019-01-28 DIAGNOSIS — Q211 Atrial septal defect: Secondary | ICD-10-CM

## 2019-01-28 DIAGNOSIS — Z111 Encounter for screening for respiratory tuberculosis: Secondary | ICD-10-CM

## 2019-01-28 DIAGNOSIS — M5442 Lumbago with sciatica, left side: Secondary | ICD-10-CM

## 2019-01-28 DIAGNOSIS — K219 Gastro-esophageal reflux disease without esophagitis: Secondary | ICD-10-CM

## 2019-01-28 DIAGNOSIS — Z79899 Other long term (current) drug therapy: Secondary | ICD-10-CM | POA: Diagnosis not present

## 2019-01-28 DIAGNOSIS — L089 Local infection of the skin and subcutaneous tissue, unspecified: Secondary | ICD-10-CM

## 2019-01-28 DIAGNOSIS — Z13220 Encounter for screening for lipoid disorders: Secondary | ICD-10-CM | POA: Diagnosis not present

## 2019-01-28 DIAGNOSIS — E663 Overweight: Secondary | ICD-10-CM

## 2019-01-28 DIAGNOSIS — I10 Essential (primary) hypertension: Secondary | ICD-10-CM

## 2019-01-28 MED ORDER — TOPIRAMATE 50 MG PO TABS: 50.0000 mg | ORAL_TABLET | Freq: Every day | ORAL | 1 refills | Status: DC

## 2019-01-28 MED ORDER — DOXYCYCLINE HYCLATE 100 MG PO CAPS: ORAL_CAPSULE | ORAL | 0 refills | Status: DC

## 2019-01-28 MED FILL — TOPIRAMATE 50 MG TABLET: 50 | 30 days supply | Qty: 30 | Fill #0

## 2019-01-28 MED FILL — DOXYCYCLINE HYC 100 MG CAPS: 100 | 10 days supply | Qty: 20 | Fill #0

## 2019-01-28 NOTE — Patient Instructions (Addendum)
Who Qualifies for Obesity Medications? Although everyone is hopeful for a fast and easy way to lose weight, nothing has been shown to replace a prudent, calorie-controlled diet along with behavior modification as a cornerstone for all obesity treatments.   The next tool that can be used to achieve weight-loss and health improvement is medication.   Pharmacotherapy may be offered to Individuals affected by obesity who have failed to achieve weight-loss through diet and exercise alone.  We have decided to try you on a weight loss medication here is some general information about this medication.    TOPIRAMATE Sometimes we will prescribe topiramate AND phentermine together to make Qsymia- separating the medications makes it cheaper.  Or topiramate can be used by itself for weight loss at night.   How to start it start on 1/2 pill for 3-5 nights, can increase to a whole pill for 1-2 weeks.  This medication is good for weight loss, headaches, pain This medication can cause numbness, tingling and can cause brain fog- stop if you get these Check with your eye doctor if you have a history of glaucoma and stop if you severe vision changes or blurry vision.  There is a long acting medication that we can switch to if you have numbness,tingling or brain fog.   Topiramate has been associated with an increased risk of birth defects- and should NOT be taken if pregnant or becoming pregnant.   There is an extended release form if you have any issues with the immediate release.  Follow-up Visits: Frequent visits (every 3 to 4 weeks) are encouraged until initial weight-loss goals (5 to 10 percent of body weight) are achieved.   At that point, less frequent visits are typically scheduled as needed for individual patients. However, since obesity is considered a chronic life-long problem for many individuals, periodic continual follow up is recommended.  Research has shown that weight-loss as low as 5  percent of initial body weight can lead to favorable improvements in blood pressure, cholesterol, glucose levels and insulin sensitivity. The risk of developing heart disease is reduced the most in patients who have impaired glucose tolerance, type 2 diabetes or high blood pressure.    Look up piriformis syndrome exercises Can take topamax twice a day DUE to this feeling If not better we can refer to physical therapy or I can do an injection here.

## 2019-01-29 ENCOUNTER — Other Ambulatory Visit: Payer: Self-pay | Admitting: Gynecology

## 2019-01-29 DIAGNOSIS — Z1231 Encounter for screening mammogram for malignant neoplasm of breast: Secondary | ICD-10-CM

## 2019-01-29 LAB — CBC WITH DIFFERENTIAL/PLATELET
Absolute Monocytes: 489 cells/uL (ref 200–950)
Basophils Absolute: 31 cells/uL (ref 0–200)
Basophils Relative: 0.6 %
Eosinophils Absolute: 172 cells/uL (ref 15–500)
Eosinophils Relative: 3.3 %
HCT: 37.7 % (ref 35.0–45.0)
Hemoglobin: 12.7 g/dL (ref 11.7–15.5)
Lymphs Abs: 1950 cells/uL (ref 850–3900)
MCH: 30.5 pg (ref 27.0–33.0)
MCHC: 33.7 g/dL (ref 32.0–36.0)
MCV: 90.4 fL (ref 80.0–100.0)
MPV: 12.2 fL (ref 7.5–12.5)
Monocytes Relative: 9.4 %
Neutro Abs: 2558 cells/uL (ref 1500–7800)
Neutrophils Relative %: 49.2 %
Platelets: 212 10*3/uL (ref 140–400)
RBC: 4.17 10*6/uL (ref 3.80–5.10)
RDW: 12.1 % (ref 11.0–15.0)
Total Lymphocyte: 37.5 %
WBC: 5.2 10*3/uL (ref 3.8–10.8)

## 2019-01-29 LAB — COMPLETE METABOLIC PANEL WITH GFR
AG Ratio: 1.9 (calc) (ref 1.0–2.5)
ALT: 26 U/L (ref 6–29)
AST: 21 U/L (ref 10–35)
Albumin: 4.2 g/dL (ref 3.6–5.1)
Alkaline phosphatase (APISO): 73 U/L (ref 31–125)
BUN: 21 mg/dL (ref 7–25)
CO2: 27 mmol/L (ref 20–32)
Calcium: 9.3 mg/dL (ref 8.6–10.2)
Chloride: 108 mmol/L (ref 98–110)
Creat: 0.82 mg/dL (ref 0.50–1.10)
GFR, Est African American: 99 mL/min/{1.73_m2} (ref >=60)
GFR, Est Non African American: 85 mL/min/{1.73_m2} (ref >=60)
Globulin: 2.2 g/dL (calc) (ref 1.9–3.7)
Glucose, Bld: 95 mg/dL (ref 65–99)
Potassium: 4.7 mmol/L (ref 3.5–5.3)
Sodium: 145 mmol/L (ref 135–146)
Total Bilirubin: 0.3 mg/dL (ref 0.2–1.2)
Total Protein: 6.4 g/dL (ref 6.1–8.1)

## 2019-01-29 LAB — MICROALBUMIN / CREATININE URINE RATIO
Creatinine, Urine: 103 mg/dL (ref 20–275)
Microalb Creat Ratio: 6 mcg/mg creat (ref <30)
Microalb, Ur: 0.6 mg/dL

## 2019-01-29 LAB — TSH: TSH: 2.41 mIU/L

## 2019-01-29 LAB — URINALYSIS, ROUTINE W REFLEX MICROSCOPIC
Bilirubin Urine: NEGATIVE
Glucose, UA: NEGATIVE
Hgb urine dipstick: NEGATIVE
Ketones, ur: NEGATIVE
Leukocytes,Ua: NEGATIVE
Nitrite: NEGATIVE
Protein, ur: NEGATIVE
Specific Gravity, Urine: 1.023 (ref 1.001–1.03)
pH: 6.5 (ref 5.0–8.0)

## 2019-01-29 LAB — HEMOGLOBIN A1C
Hgb A1c MFr Bld: 5.7 % of total Hgb — ABNORMAL HIGH (ref <5.7)
Mean Plasma Glucose: 117 (calc)
eAG (mmol/L): 6.5 (calc)

## 2019-01-29 LAB — LIPID PANEL
Cholesterol: 202 mg/dL — ABNORMAL HIGH (ref <200)
HDL: 49 mg/dL — ABNORMAL LOW (ref >=50)
LDL Cholesterol (Calc): 122 mg/dL (calc) — ABNORMAL HIGH
Non-HDL Cholesterol (Calc): 153 mg/dL (calc) — ABNORMAL HIGH (ref <130)
Total CHOL/HDL Ratio: 4.1 (calc) (ref <5.0)
Triglycerides: 184 mg/dL — ABNORMAL HIGH (ref <150)

## 2019-01-29 LAB — VITAMIN D 25 HYDROXY (VIT D DEFICIENCY, FRACTURES): Vit D, 25-Hydroxy: 56 ng/mL (ref 30–100)

## 2019-01-29 LAB — MAGNESIUM: Magnesium: 2 mg/dL (ref 1.5–2.5)

## 2019-02-25 NOTE — Progress Notes (Addendum)
Weight Managment  48 y.o.female presents for a follow up after being on Topamax 50mg  tablet every night.  She has tried and failed phentermine in the past for weight loss.   While on the medication they have lost 2 lbs since last visit which was 4 weeks ago.  They deny palpitations, anxiety, trouble sleeping, elevated BP.  Denies any side effects denies any numbness or tingling.   BP Readings from Last 3 Encounters:  02/26/19 120/68  01/28/19 114/80  10/17/18 118/76    BMI is Body mass index is 31.47 kg/m., she is working on diet and exercise. Wt Readings from Last 3 Encounters:  02/26/19 195 lb (88.5 kg)  01/28/19 197 lb 6.4 oz (89.5 kg)  09/21/18 191 lb (86.6 kg)    Typical breakfast: One egg, microwave bacon x2 pieces  Typical lunch: Wrap with [place] ham & swiss, lettuce pickles, small mayo & Mustard.  Chips, small portion.     Typical dinner: Lean meats, chicken, hamburger, steak with salad, baked sweet potato, white and sweet potatoes.  Left over for lunch next day at times.  Reports that she has goal of water of 96oz on a marked water bottle.  She has started drink tea, unsweet, splenda.  Reports she is going to improve on this.  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  She reports she has not been exercising like she used to. She reports her husband is a good motivator and he stopped related to his health.  He has recently resumed exercise and she reports she will be joining in.  She reports she used to walk in her neighborhood as well.   Medications:    Current Outpatient Medications (Respiratory):  .  fexofenadine (ALLEGRA) 180 MG tablet, Take 180 mg by mouth daily.  Current Outpatient Medications (Analgesics):  .  aspirin EC 81 MG tablet, Take 81 mg by mouth daily. Marland Kitchen  ibuprofen (ADVIL,MOTRIN) 200 MG tablet, Take 200 mg by mouth every 6 (six) hours as needed for mild pain.  Current Outpatient Medications (Hematological):  Marland Kitchen  Cyanocobalamin (B-12 SL),  Place under the tongue.  Current Outpatient Medications (Other):  Marland Kitchen  Cholecalciferol (VITAMIN D PO), Take by mouth daily. .  Red Yeast Rice Extract (RED YEAST RICE PO), Take by mouth 2 (two) times daily. Marland Kitchen  topiramate (TOPAMAX) 50 MG tablet, Take 1 tablet (50 mg total) by mouth daily. Marland Kitchen  doxycycline (VIBRAMYCIN) 100 MG capsule, Take 1 capsule twice daily with food  ROS: All negative except for above  Physical exam: Vitals:   02/26/19 1143  BP: 120/68  Pulse: 70  Temp: 97.7 F (36.5 C)  SpO2: 98%   Physical Exam Constitutional:      Appearance: Normal appearance.  HENT:     Head: Normocephalic.  Cardiovascular:     Rate and Rhythm: Normal rate and regular rhythm.     Heart sounds: Normal heart sounds.  Pulmonary:     Effort: Pulmonary effort is normal.  Musculoskeletal: Normal range of motion.  Skin:    Findings: Lesion present.  Neurological:     General: No focal deficit present.     Mental Status: She is alert and oriented to person, place, and time.  Psychiatric:        Mood and Affect: Mood normal.        Behavior: Behavior normal.        Thought Content: Thought content normal.        Judgment: Judgment normal.  Assessment:  Kathryn Larsen was seen today for follow-up.  Diagnoses and all orders for this visit:  Encounter for weight management Monthly  Class 1 obesity due to excess calories without serious comorbidity with body mass index (BMI) of 31.0 to 31.9 in adult Discussed dietary and exercise modifications Taking topamax 50mg  at bedtime, tolerating well May take second dose at dinner Discussed medication and side effects -Encouraged healthy behaviors and adding physical activity recommenced substitutions with current diet  Epidermal cyst? of face Responded to previous treatment, not completley resolved. No adverse effects from medication, one more round of doxycycline.  Consider Dermatology, encapsulated? -     doxycycline (VIBRAMYCIN) 100 MG  capsule; Take 1 capsule twice daily with food     Plan: She will work on meal planning, intentional eating, and increasing water.  She has been instructed to work up to a goal of 150 minutes of combined cardio and strengthening exercise per week for weight loss and overall health benefits.  Start small 5-10 min a day work incorporate exercise back into her daily routine. We discussed the following Behavioral Modification Strategies today: increasing lean protein intake, decreasing simple carbohydrates, increasing vegetables, increase H20 intake, decrease eating out, no skipping meals, work on meal planning and easy cooking plans, keeping healthy foods in the home, and planning for success.   She has agreed to follow-up with our clinic in 4 weeks. She was informed of the importance of frequent follow-up visits to maximize her success with intensive lifestyle modifications for her multiple health conditions.  Medication refill: Not needed today, has refill.  Will run out early as increased to BID dosing.  Future Appointments  Date Time Provider [place]  03/20/2019  8:40 AM [facility] MM 2 [facility]  03/28/2019  9:00 AM Garnet Sierras, NP [facility] None  08/01/2019  8:45 AM Vicie Mutters, PA-C [facility] None  09/24/2019  9:00 AM Fontaine, Belinda Block, MD [facility] Mariane Baumgarten  01/29/2020  2:00 PM Vicie Mutters, PA-C [facility] None      Garnet Sierras, NP [facility] 02/26/2019  1:51 PM

## 2019-02-26 ENCOUNTER — Encounter: Payer: Self-pay | Admitting: Adult Health Nurse Practitioner

## 2019-02-26 ENCOUNTER — Ambulatory Visit (INDEPENDENT_AMBULATORY_CARE_PROVIDER_SITE_OTHER): Payer: PRIVATE HEALTH INSURANCE | Admitting: Adult Health Nurse Practitioner

## 2019-02-26 ENCOUNTER — Other Ambulatory Visit: Payer: Self-pay

## 2019-02-26 VITALS — BP 120/68 | HR 70 | Temp 97.7°F | Wt 195.0 lb

## 2019-02-26 DIAGNOSIS — E6609 Other obesity due to excess calories: Secondary | ICD-10-CM | POA: Diagnosis not present

## 2019-02-26 DIAGNOSIS — Z6831 Body mass index (BMI) 31.0-31.9, adult: Secondary | ICD-10-CM

## 2019-02-26 DIAGNOSIS — L720 Epidermal cyst: Secondary | ICD-10-CM

## 2019-02-26 DIAGNOSIS — Z7689 Persons encountering health services in other specified circumstances: Secondary | ICD-10-CM | POA: Diagnosis not present

## 2019-02-26 DIAGNOSIS — E66811 Obesity, class 1: Secondary | ICD-10-CM

## 2019-02-26 MED ORDER — DOXYCYCLINE HYCLATE 100 MG PO CAPS: ORAL_CAPSULE | ORAL | 0 refills | Status: DC

## 2019-02-26 MED FILL — TOPIRAMATE 50 MG TABLET: 50 | 30 days supply | Qty: 30 | Fill #1

## 2019-02-26 MED FILL — DOXYCYCLINE HYC 100 MG CAPS: 100 | 10 days supply | Qty: 20 | Fill #0

## 2019-02-26 NOTE — Patient Instructions (Addendum)
Work on finding replacements for the sweets you enjoy.  Try slicing bananas and place on cookie sheet to freeze them.  Then you have a bag in the freezer to dip into.  Continue to modify your diet by replacing food for healthier options.  Consider changing baked potato to half, split one OR change to mashed Cauliflower.  You can find this in the frozen section.   We can increase the Topiramate 500mg  one tablet at dinner and one at bedtime.  Work on incorporating exercise, even 5-72min.  Force yourself to get started and the habits will form  We are going to follow up in one month with weight check.       When it comes to diets, agreement about the perfect plan isn't easy to find, even among the experts. Experts at the [employer] developed an idea known as the Healthy Eating Plate. Just imagine a plate divided into logical, healthy portions.  The emphasis is on diet quality:  Load up on vegetables and fruits - one-half of your plate: Aim for color and variety, and remember that potatoes don't count.  Go for whole grains - one-quarter of your plate: Whole wheat, barley, wheat berries, quinoa, oats, brown rice, and foods made with them. If you want pasta, go with whole wheat pasta.  Protein power - one-quarter of your plate: Fish, chicken, beans, and nuts are all healthy, versatile protein sources. Limit red meat.  The diet, however, does go beyond the plate, offering a few other suggestions.  Use healthy plant oils, such as olive, canola, soy, corn, sunflower and peanut. Check the labels, and avoid partially hydrogenated oil, which have unhealthy trans fats.  If you're thirsty, drink water. Coffee and tea are good in moderation, but skip sugary drinks and limit milk and dairy products to one or two daily servings.  The type of carbohydrate in the diet is more important than the amount. Some sources of carbohydrates, such as vegetables, fruits, whole grains, and  beans-are healthier than others.

## 2019-03-12 ENCOUNTER — Other Ambulatory Visit: Payer: Self-pay | Admitting: Adult Health Nurse Practitioner

## 2019-03-12 DIAGNOSIS — L720 Epidermal cyst: Secondary | ICD-10-CM

## 2019-03-20 ENCOUNTER — Other Ambulatory Visit: Payer: Self-pay

## 2019-03-20 ENCOUNTER — Ambulatory Visit
Admission: RE | Admit: 2019-03-20 | Discharge: 2019-03-20 | Disposition: A | Discharge status: Home / Self Care | Payer: No Typology Code available for payment source | Source: Ambulatory Visit | Attending: Gynecology | Admitting: Gynecology

## 2019-03-20 DIAGNOSIS — Z1231 Encounter for screening mammogram for malignant neoplasm of breast: Secondary | ICD-10-CM

## 2019-03-25 NOTE — Progress Notes (Deleted)
Weight Managment  48 y.o.female presents for a follow up after being on Topamax 50mg  tablet every night.  She has tried and failed phentermine in the past for weight loss.   While on the medication they have lost 2 lbs since last visit which was 4 weeks ago.  They deny palpitations, anxiety, trouble sleeping, elevated BP.  Denies any side effects denies any numbness or tingling.   BP Readings from Last 3 Encounters:  02/26/19 120/68  01/28/19 114/80  10/17/18 118/76    BMI is There is no height or weight on file to calculate BMI., she is working on diet and exercise. Wt Readings from Last 3 Encounters:  02/26/19 195 lb (88.5 kg)  01/28/19 197 lb 6.4 oz (89.5 kg)  09/21/18 191 lb (86.6 kg)    Typical breakfast: One egg, microwave bacon x2 pieces  Typical lunch: Wrap with [place] ham & swiss, lettuce pickles, small mayo & Mustard.  Chips, small portion.     Typical dinner: Lean meats, chicken, hamburger, steak with salad, baked sweet potato, white and sweet potatoes.  Left over for lunch next day at times.  Reports that she has goal of water of 96oz on a marked water bottle.  She has started drink tea, unsweet, splenda.  Reports she is going to improve on this.  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  She reports she has not been exercising like she used to. She reports her husband is a good motivator and he stopped related to his health.  He has recently resumed exercise and she reports she will be joining in.  She reports she used to walk in her neighborhood as well.   Medications:    Current Outpatient Medications (Respiratory):  .  fexofenadine (ALLEGRA) 180 MG tablet, Take 180 mg by mouth daily.  Current Outpatient Medications (Analgesics):  .  aspirin EC 81 MG tablet, Take 81 mg by mouth daily. Marland Kitchen  ibuprofen (ADVIL,MOTRIN) 200 MG tablet, Take 200 mg by mouth every 6 (six) hours as needed for mild pain.  Current Outpatient Medications (Hematological):  Marland Kitchen   Cyanocobalamin (B-12 SL), Place under the tongue.  Current Outpatient Medications (Other):  Marland Kitchen  Cholecalciferol (VITAMIN D PO), Take by mouth daily. Marland Kitchen  doxycycline (VIBRAMYCIN) 100 MG capsule, Take 1 capsule twice daily with food .  Red Yeast Rice Extract (RED YEAST RICE PO), Take by mouth 2 (two) times daily. Marland Kitchen  topiramate (TOPAMAX) 50 MG tablet, Take 1 tablet (50 mg total) by mouth daily.  ROS: All negative except for above  Physical exam: There were no vitals filed for this visit. Physical Exam Constitutional:      Appearance: Normal appearance.  HENT:     Head: Normocephalic.  Cardiovascular:     Rate and Rhythm: Normal rate and regular rhythm.     Heart sounds: Normal heart sounds.  Pulmonary:     Effort: Pulmonary effort is normal.  Musculoskeletal:        General: Normal range of motion.  Skin:    Findings: Lesion present.  Neurological:     General: No focal deficit present.     Mental Status: She is alert and oriented to person, place, and time.  Psychiatric:        Mood and Affect: Mood normal.        Behavior: Behavior normal.        Thought Content: Thought content normal.        Judgment: Judgment normal.  Assessment:  Kathryn Larsen was seen today for follow-up.  Diagnoses and all orders for this visit:  Encounter for weight management Monthly  Class 1 obesity due to excess calories without serious comorbidity with body mass index (BMI) of 31.0 to 31.9 in adult Discussed dietary and exercise modifications Taking topamax 50mg  at bedtime, tolerating well May take second dose at dinner Discussed medication and side effects -Encouraged healthy behaviors and adding physical activity recommenced substitutions with current diet  Epidermal cyst? of face Responded to previous treatment, not completley resolved. No adverse effects from medication, one more round of doxycycline.  Consider Dermatology, encapsulated? -     doxycycline (VIBRAMYCIN) 100 MG capsule;  Take 1 capsule twice daily with food     Plan: She will work on meal planning, intentional eating, and increasing water.  She has been instructed to work up to a goal of 150 minutes of combined cardio and strengthening exercise per week for weight loss and overall health benefits.  Start small 5-10 min a day work incorporate exercise back into her daily routine. We discussed the following Behavioral Modification Strategies today: increasing lean protein intake, decreasing simple carbohydrates, increasing vegetables, increase H20 intake, decrease eating out, no skipping meals, work on meal planning and easy cooking plans, keeping healthy foods in the home, and planning for success.   She has agreed to follow-up with our clinic in 4 weeks. She was informed of the importance of frequent follow-up visits to maximize her success with intensive lifestyle modifications for her multiple health conditions.  Medication refill: Not needed today, has refill.  Will run out early as increased to BID dosing.  Future Appointments  Date Time Provider [place]  03/26/2019 11:00 AM Garnet Sierras, NP [facility] None  08/01/2019  8:45 AM Vicie Mutters, PA-C [facility] None  09/24/2019  9:00 AM Fontaine, Belinda Block, MD [facility] Baumgarten  01/29/2020  2:00 PM Vicie Mutters, PA-C [facility] None      Garnet Sierras, NP [facility] 03/25/2019  10:19 AM

## 2019-03-26 ENCOUNTER — Ambulatory Visit: Payer: PRIVATE HEALTH INSURANCE | Admitting: Adult Health Nurse Practitioner

## 2019-03-28 ENCOUNTER — Ambulatory Visit: Payer: PRIVATE HEALTH INSURANCE | Admitting: Adult Health Nurse Practitioner

## 2019-04-12 IMAGING — MR MR KNEE*R* W/O CM
4 of 6 series · 22 of 40 positions shown · non-contrast
Comparison: None.

CLINICAL DATA: Right knee pain status post injury 6 months ago.

EXAM:
MRI OF THE RIGHT KNEE WITHOUT CONTRAST
TECHNIQUE: Multiplanar, multisequence MR imaging of the knee was performed. No
intravenous contrast was administered.

[Series 12: PD fat-sat · axial · 4.0mm · 0.42mm/px · z∈[-100,+10]mm · 7 of 24 slices shown (1 of 3)]
[im 1/24]
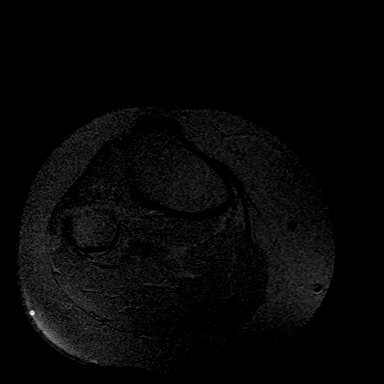
[im 4/24]
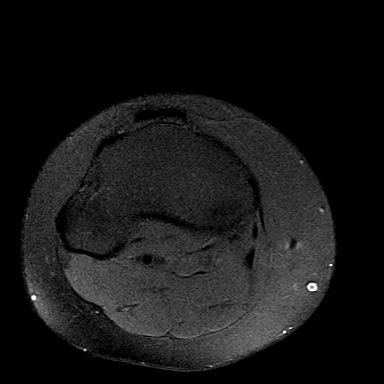
[im 8/24]
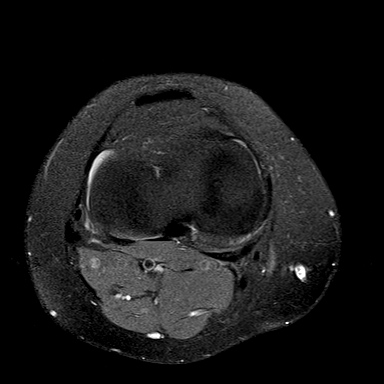
[im 12/24]
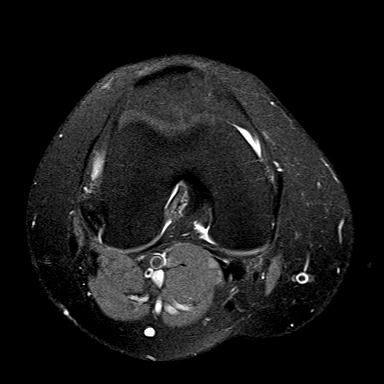
[im 16/24]
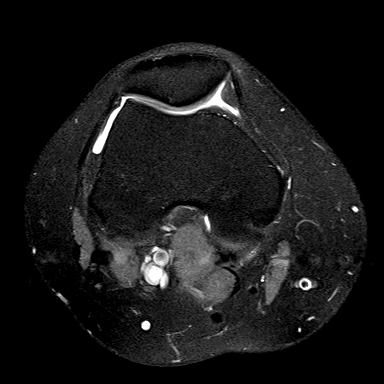
[im 20/24]
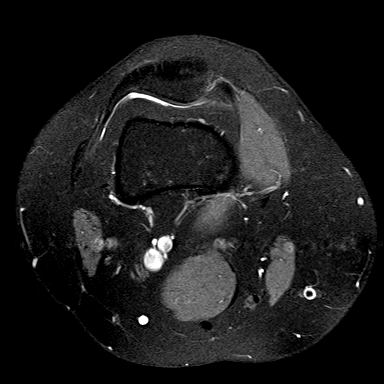
[im 24/24]
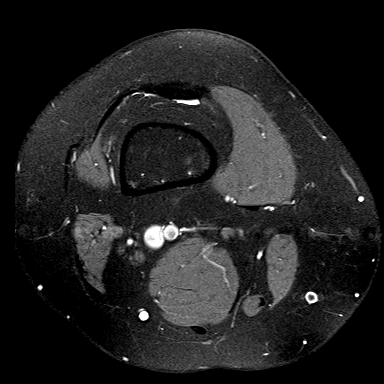

[Series 14: T2 fat-sat · coronal · 3.0mm · 0.29mm/px · 3 of 30 slices shown]
[im 5/30]
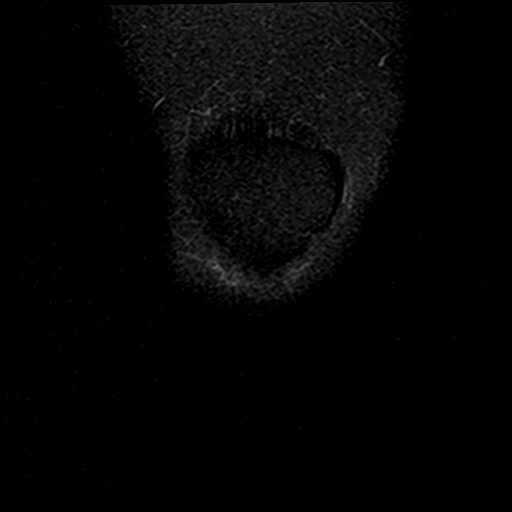
[im 17/30]
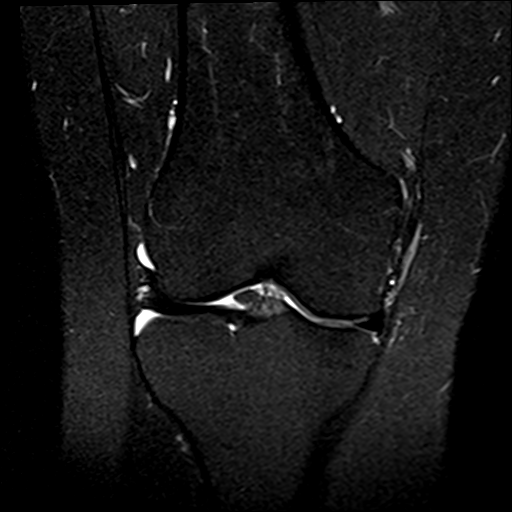
[im 25/30]
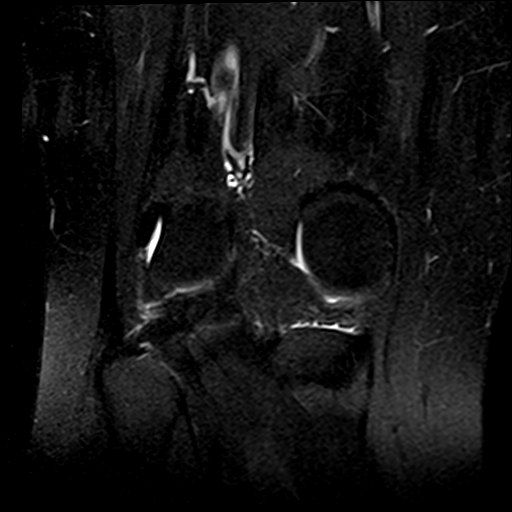

[Series 15: PD fat-sat · coronal · 3.0mm · 0.29mm/px · 8 of 30 slices shown (2 of 3)]
[im 1/30]
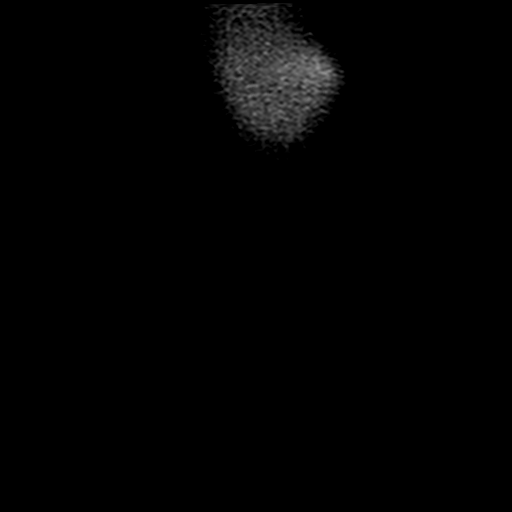
[im 5/30]
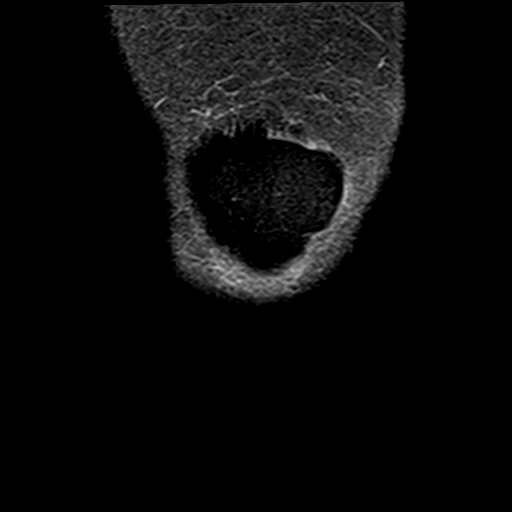
[im 9/30]
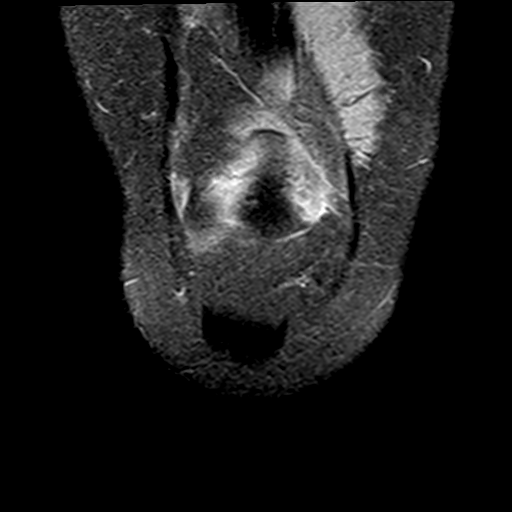
[im 13/30]
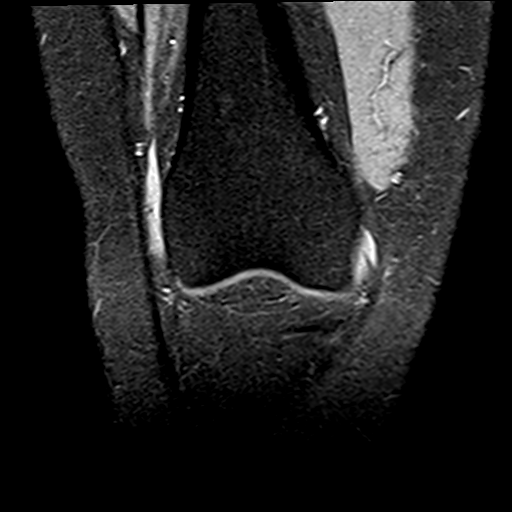
[im 17/30]
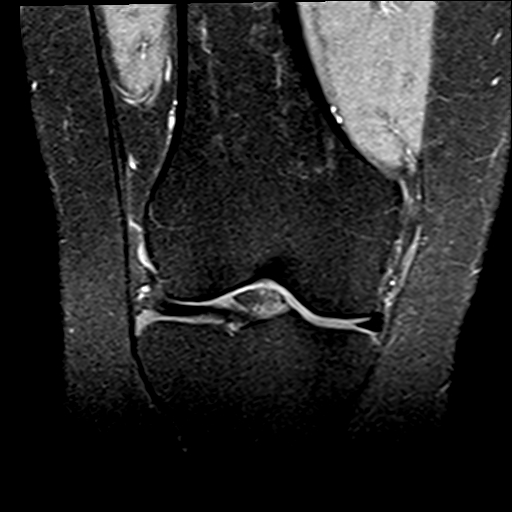
[im 21/30]
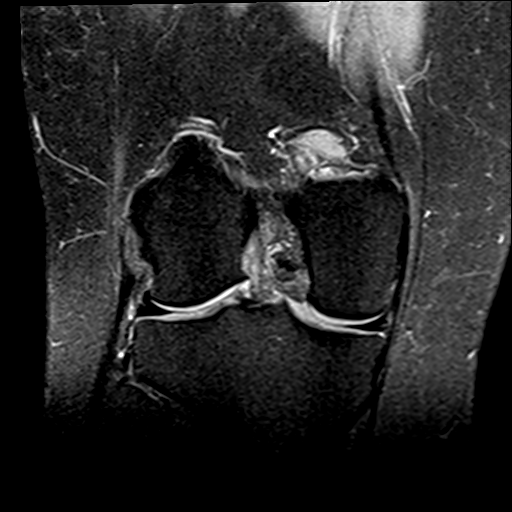
[im 25/30]
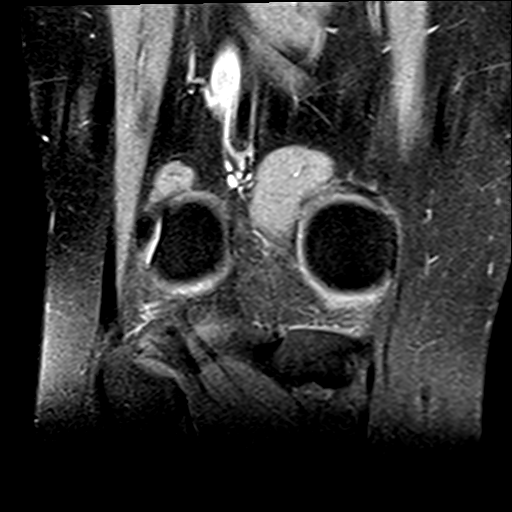
[im 30/30]
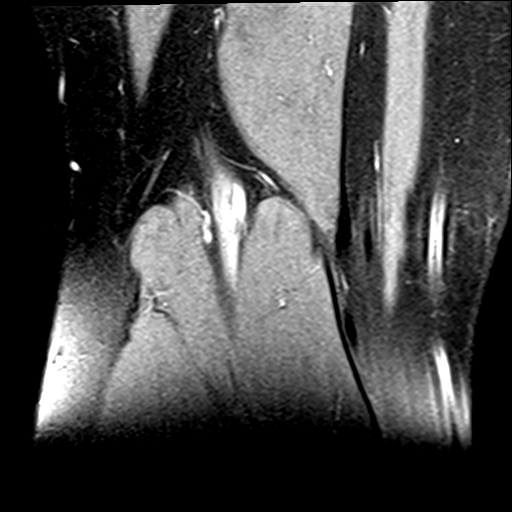

[Series 16: PD fat-sat · sagittal · 4.0mm · 0.31mm/px · 4 of 19 slices shown (3 of 3)]
[im 1/19]
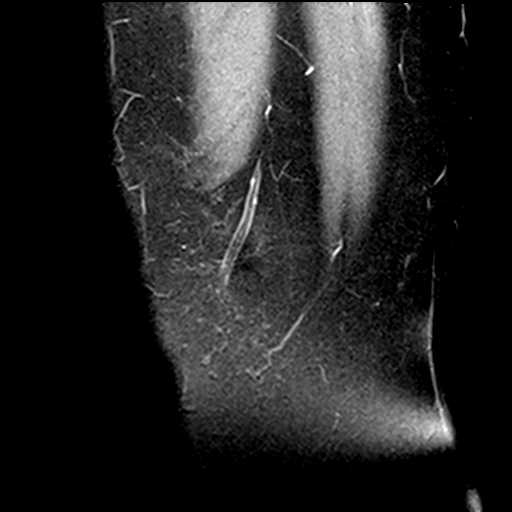
[im 5/19]
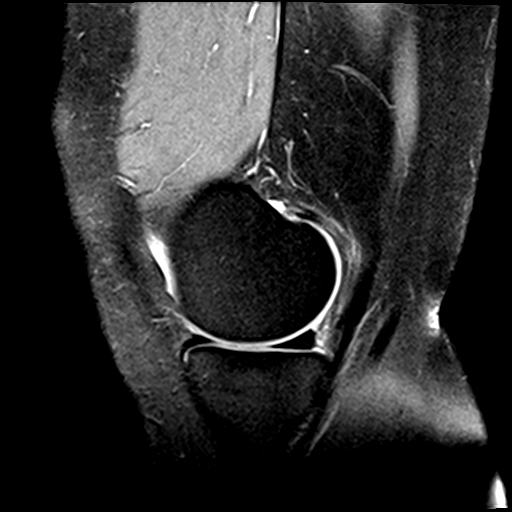
[im 10/19]
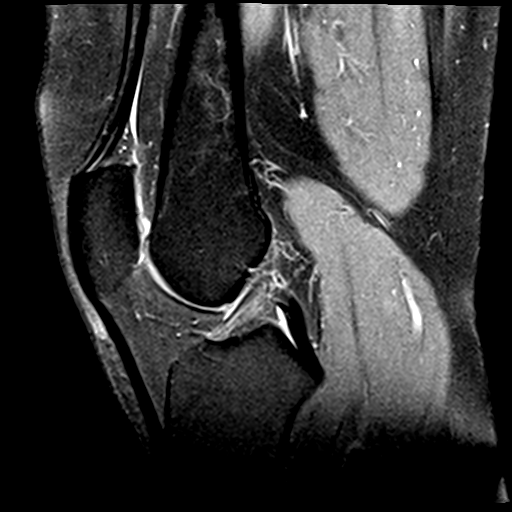
[im 19/19]
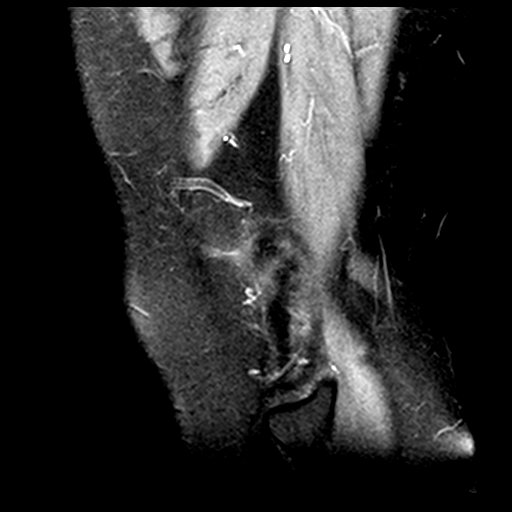

[22 of 40 positions shown; findings below may reference images not displayed]

FINDINGS: MENISCI

Medial meniscus:  Intact.

Lateral meniscus:  Intact.

LIGAMENTS

Cruciates:  Intact ACL and PCL.

Collaterals: Medial collateral ligament is intact. Lateral
collateral ligament complex is intact.

CARTILAGE

Patellofemoral: Mild partial-thickness cartilage loss of the
patellar apex.

Medial:  No chondral defect.

Lateral:  No chondral defect.

Joint: No joint effusion. Normal Hoffa's fat. No plical thickening.

Popliteal Fossa:  No Baker cyst. Intact popliteus tendon.

Extensor Mechanism: Intact quadriceps tendon and patellar tendon.
Intact medial and lateral patellar retinaculum. Intact MPFL.

Bones: No focal marrow signal abnormality. No fracture or
dislocation.

Other: No fluid collection or hematoma.
IMPRESSION: 1. No meniscal or ligamentous injury of the right knee.
2. Mild partial-thickness cartilage loss of the patellar apex.

## 2019-05-16 NOTE — Progress Notes (Deleted)
Weight Managment  49 y.o.female presents for a follow up after being on Topamax 50mg  tablet every night.  She has tried and failed phentermine in the past for weight loss.   While on the medication they have lost 2 lbs since last visit which was 4 weeks ago.  They deny palpitations, anxiety, trouble sleeping, elevated BP.  Denies any side effects denies any numbness or tingling.   BP Readings from Last 3 Encounters:  02/26/19 120/68  01/28/19 114/80  10/17/18 118/76    BMI is There is no height or weight on file to calculate BMI., she is working on diet and exercise. Wt Readings from Last 3 Encounters:  02/26/19 195 lb (88.5 kg)  01/28/19 197 lb 6.4 oz (89.5 kg)  09/21/18 191 lb (86.6 kg)    Typical breakfast: One egg, microwave bacon x2 pieces  Typical lunch: Wrap with [place] ham & swiss, lettuce pickles, small mayo & Mustard.  Chips, small portion.     Typical dinner: Lean meats, chicken, hamburger, steak with salad, baked sweet potato, white and sweet potatoes.  Left over for lunch next day at times.  Reports that she has goal of water of 96oz on a marked water bottle.  She has started drink tea, unsweet, splenda.  Reports she is going to improve on this.  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  She reports she has not been exercising like she used to. She reports her husband is a good motivator and he stopped related to his health.  He has recently resumed exercise and she reports she will be joining in.  She reports she used to walk in her neighborhood as well.   Medications:    Current Outpatient Medications (Respiratory):  .  fexofenadine (ALLEGRA) 180 MG tablet, Take 180 mg by mouth daily.  Current Outpatient Medications (Analgesics):  .  aspirin EC 81 MG tablet, Take 81 mg by mouth daily. Marland Kitchen  ibuprofen (ADVIL,MOTRIN) 200 MG tablet, Take 200 mg by mouth every 6 (six) hours as needed for mild pain.  Current Outpatient Medications (Hematological):  Marland Kitchen   Cyanocobalamin (B-12 SL), Place under the tongue.  Current Outpatient Medications (Other):  Marland Kitchen  Cholecalciferol (VITAMIN D PO), Take by mouth daily. Marland Kitchen  doxycycline (VIBRAMYCIN) 100 MG capsule, Take 1 capsule twice daily with food .  Red Yeast Rice Extract (RED YEAST RICE PO), Take by mouth 2 (two) times daily. Marland Kitchen  topiramate (TOPAMAX) 50 MG tablet, Take 1 tablet (50 mg total) by mouth daily.  ROS: All negative except for above  Physical exam: There were no vitals filed for this visit. Physical Exam Constitutional:      Appearance: Normal appearance.  HENT:     Head: Normocephalic.  Cardiovascular:     Rate and Rhythm: Normal rate and regular rhythm.     Heart sounds: Normal heart sounds.  Pulmonary:     Effort: Pulmonary effort is normal.  Musculoskeletal:        General: Normal range of motion.  Skin:    Findings: Lesion present.  Neurological:     General: No focal deficit present.     Mental Status: She is alert and oriented to person, place, and time.  Psychiatric:        Mood and Affect: Mood normal.        Behavior: Behavior normal.        Thought Content: Thought content normal.        Judgment: Judgment normal.  Assessment:  Graclynn was seen today for follow-up.  Diagnoses and all orders for this visit:  Encounter for weight management Monthly  Class 1 obesity due to excess calories without serious comorbidity with body mass index (BMI) of 31.0 to 31.9 in adult Discussed dietary and exercise modifications Taking topamax 50mg  at bedtime, tolerating well May take second dose at dinner Discussed medication and side effects -Encouraged healthy behaviors and adding physical activity recommenced substitutions with current diet  Epidermal cyst? of face Responded to previous treatment, not completley resolved. No adverse effects from medication, one more round of doxycycline.  Consider Dermatology, encapsulated? -     doxycycline (VIBRAMYCIN) 100 MG capsule;  Take 1 capsule twice daily with food     Plan: She will work on meal planning, intentional eating, and increasing water.  She has been instructed to work up to a goal of 150 minutes of combined cardio and strengthening exercise per week for weight loss and overall health benefits.  Start small 5-10 min a day work incorporate exercise back into her daily routine. We discussed the following Behavioral Modification Strategies today: increasing lean protein intake, decreasing simple carbohydrates, increasing vegetables, increase H20 intake, decrease eating out, no skipping meals, work on meal planning and easy cooking plans, keeping healthy foods in the home, and planning for success.   She has agreed to follow-up with our clinic in 4 weeks. She was informed of the importance of frequent follow-up visits to maximize her success with intensive lifestyle modifications for her multiple health conditions.  Medication refill: Not needed today, has refill.  Will run out early as increased to BID dosing.  Future Appointments  Date Time Provider [place]  05/17/2019 10:30 AM Vicie Mutters, PA-C [facility] None  08/01/2019  8:45 AM Vicie Mutters, PA-C [facility] None  09/24/2019  9:00 AM Joseph Pierini, MD [facility]  01/29/2020  2:00 PM Vicie Mutters, PA-C [facility] None      Garnet Sierras, NP [facility] 05/16/2019  9:03 PM

## 2019-05-17 ENCOUNTER — Ambulatory Visit: Payer: PRIVATE HEALTH INSURANCE | Admitting: Physician Assistant

## 2019-07-22 ENCOUNTER — Encounter: Payer: No Typology Code available for payment source | Admitting: Physician Assistant

## 2019-07-29 DIAGNOSIS — R7309 Other abnormal glucose: Secondary | ICD-10-CM | POA: Insufficient documentation

## 2019-07-29 NOTE — Progress Notes (Addendum)
FOLLOW UP  Assessment and Plan:  Essential hypertension -     CBC with Differential/Platelet -     COMPLETE METABOLIC PANEL WITH GFR -     TSH - continue medications, DASH diet, exercise and monitor at home. Call if greater than 130/80.   Mixed hyperlipidemia -     Lipid panel check lipids decrease fatty foods increase activity.   Vitamin D deficiency -     VITAMIN D 25 Hydroxy (Vit-D Deficiency, Fractures)  Medication management -     Magnesium  Overweight The requested drug is being used as an adjunct to lifestyle modification (e.g., dietary or caloric restriction, exercise, behavioral support, community based program). The patient has engaged in > 18months of lifestyle modification and failed to achieve desired weight loss, Has tried phetnermine and topamax,. The patient has BMI greater than or equal to 30 kilograms / square meter with additional disorder related to weight (high cholesterol, hypertension, diabetes, or sleep apnea).    Discussed med's effects and SE's. Screening labs and tests as requested with regular follow-up as recommended. Over 30 minutes of exam, counseling, chart review, and complex, high level critical decision making was performed this visit.  Future Appointments  Date Time Provider [place]  09/24/2019  9:00 AM Joseph Pierini, MD [facility]  01/29/2020  2:00 PM Vicie Mutters, PA-C [facility] None    HPI  49 y.o. female  presents for a follow up for has Acid reflux; Eczema; PFO (patent foramen ovale); and Abnormal glucose on their problem list..  Her blood pressure has been controlled at home, today their BP is BP: 122/78 She does workout, trying to with work and school. She denies chest pain, shortness of breath, dizziness.  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  BMI is Body mass index is 32.6 kg/m., they joined bowling league,  drinking beer and doing pizza on Wednesday. Has failed phentermine, topamax, would like to try saxenda.  Wt Readings from Last 3 Encounters:  08/01/19 202 lb (91.6 kg)  02/26/19 195 lb (88.5 kg)  01/28/19 197 lb 6.4 oz (89.5 kg)    She is not on cholesterol medication and denies myalgias. Her cholesterol is not at goal. The cholesterol last visit was:   Lab Results  Component Value Date   CHOL 202 (H) 01/28/2019   HDL 49 (L) 01/28/2019   LDLCALC 122 (H) 01/28/2019   TRIG 184 (H) 01/28/2019   CHOLHDL 4.1 01/28/2019   Patient is on Vitamin D supplement, 5000 IU.   Lab Results  Component Value Date   VD25OH 56 01/28/2019     Last A1C  Lab Results  Component Value Date   HGBA1C 5.7 (H) 01/28/2019    Current Medications:     Current Outpatient Medications (Respiratory):  .  fexofenadine (ALLEGRA) 180 MG tablet, Take 180 mg by mouth daily.  Current Outpatient Medications (Analgesics):  .  aspirin EC 81 MG tablet, Take 81 mg by mouth daily. Marland Kitchen  ibuprofen (ADVIL,MOTRIN) 200 MG tablet, Take 200 mg by mouth every 6 (six) hours as needed for mild pain.  Current Outpatient Medications (Hematological):  Marland Kitchen  Cyanocobalamin (B-12 SL), Place under the tongue.  Current Outpatient Medications (Other):  Marland Kitchen  Cholecalciferol (VITAMIN D PO), Take by mouth daily. .  Red Yeast Rice Extract (RED YEAST RICE PO), Take by mouth 2 (two) times daily. Marland Kitchen  topiramate (TOPAMAX) 50 MG tablet, Take 1 tablet (50 mg total) by mouth daily. (Patient not taking: Reported on 08/01/2019)  Allergies:  No Known Allergies   Medical History:  She has Acid reflux; Eczema; PFO (patent foramen ovale); and Abnormal glucose on their problem list.   Surgical History: reviewed and unchanged Family History: reviewed and unchanged Social History: reviewed and unchanged  Review of Systems: Review of Systems  Constitutional: Negative for chills, diaphoresis, fever, malaise/fatigue and weight loss.  HENT: Negative.    Eyes: Negative.   Respiratory: Negative.   Cardiovascular: Negative.   Gastrointestinal: Negative for abdominal pain, blood in stool, constipation, diarrhea, heartburn, melena, nausea and vomiting.  Genitourinary: Negative.        Vaginal dryness  Musculoskeletal: Negative.   Skin: Negative.   Neurological: Negative for weakness.    Physical Exam: Estimated body mass index is 32.6 kg/m as calculated from the following:   Height as of 09/21/18: 5\' 6"  (1.676 m).   Weight as of this encounter: 202 lb (91.6 kg). BP 122/78   Pulse 72   Temp 97.6 F (36.4 C)   Wt 202 lb (91.6 kg)   SpO2 97%   BMI 32.60 kg/m  General Appearance: Well nourished, in no apparent distress.  Eyes: PERRLA, EOMs, conjunctiva no swelling or erythema, normal fundi and vessels.  Sinuses: No Frontal/maxillary tenderness  ENT/Mouth: Ext aud canals clear, normal light reflex with TMs without erythema, bulging. Good dentition. No erythema, swelling, or exudate on post pharynx. Tonsils not swollen or erythematous. Hearing normal.  Neck: Supple, thyroid normal. No bruits  Respiratory: Respiratory effort normal, BS equal bilaterally without rales, rhonchi, wheezing or stridor.  Cardio: RRR without murmurs, rubs or gallops. Brisk peripheral pulses without edema.  Chest: symmetric, with normal excursions and percussion.  Abdomen: Soft, nontender, no guarding, rebound, hernias, masses, or organomegaly.  Lymphatics: Non tender without lymphadenopathy.  Musculoskeletal: Full ROM all peripheral extremities,5/5 strength, and normal gait.  Skin: Warm, dry without rashes, lesions, ecchymosis. Neuro: Cranial nerves intact, reflexes equal bilaterally. Normal muscle tone, no cerebellar symptoms. Sensation intact.  Psych: Awake and oriented X 3, normal affect, Insight and Judgment appropriate.   Vicie Mutters 9:09 AM [facility]

## 2019-08-01 ENCOUNTER — Encounter: Payer: Self-pay | Admitting: Physician Assistant

## 2019-08-01 ENCOUNTER — Other Ambulatory Visit: Payer: Self-pay

## 2019-08-01 ENCOUNTER — Ambulatory Visit (INDEPENDENT_AMBULATORY_CARE_PROVIDER_SITE_OTHER): Payer: Managed Care, Other (non HMO) | Admitting: Physician Assistant

## 2019-08-01 VITALS — BP 122/78 | HR 72 | Temp 97.6°F | Wt 202.0 lb

## 2019-08-01 DIAGNOSIS — I10 Essential (primary) hypertension: Secondary | ICD-10-CM

## 2019-08-01 DIAGNOSIS — R7309 Other abnormal glucose: Secondary | ICD-10-CM | POA: Diagnosis not present

## 2019-08-01 DIAGNOSIS — E559 Vitamin D deficiency, unspecified: Secondary | ICD-10-CM

## 2019-08-01 DIAGNOSIS — E663 Overweight: Secondary | ICD-10-CM

## 2019-08-01 DIAGNOSIS — Z79899 Other long term (current) drug therapy: Secondary | ICD-10-CM

## 2019-08-01 DIAGNOSIS — Z6832 Body mass index (BMI) 32.0-32.9, adult: Secondary | ICD-10-CM

## 2019-08-01 DIAGNOSIS — E782 Mixed hyperlipidemia: Secondary | ICD-10-CM | POA: Diagnosis not present

## 2019-08-01 MED ORDER — SAXENDA 18 MG/3ML ~~LOC~~ SOPN: 3.0000 mg | PEN_INJECTOR | Freq: Every day | SUBCUTANEOUS | 2 refills | Status: DC

## 2019-08-01 NOTE — Patient Instructions (Addendum)
Please see the instructions on the medications.  Start on 0.6mg  daily [place] of the medication x 1 week, then increase by 0.6 mg daily every week until you reach 3 mg/day.  If you get nausea stay on the tolerated dose x 1-2 more weeks and then try to move up.   Please go online [email or web address] to get the savings card  Send the form to  [email or web address]   Check out  Mini habits for weight loss book Fiber fuel book  2 free apps for tracking food is myfitness pal  loseit  If you want more structured weight loss that you have to pay for, you can look into  Noom  weight watchers  [email or web address]   Liraglutide injection (Weight Management) What is this medicine? LIRAGLUTIDE (LIR a GLOO tide) is used to help people lose weight and maintain weight loss. It is used with a reduced-calorie diet and exercise. This medicine may be used for other purposes; ask your health care provider or pharmacist if you have questions. COMMON BRAND NAME(S): Saxenda What should I tell my health care provider before I take this medicine? They need to know if you have any of these conditions:  endocrine tumors (MEN 2) or if someone in your family had these tumors  gallbladder disease  high cholesterol  history of alcohol abuse problem  history of pancreatitis  kidney disease or if you are on dialysis  liver disease  previous swelling of the tongue, face, or lips with difficulty breathing, difficulty swallowing, hoarseness, or tightening of the throat  stomach problems  suicidal thoughts, plans, or attempt; a previous suicide attempt by you or a family member  thyroid cancer or if someone in your family had thyroid cancer  an unusual or allergic reaction to liraglutide, other medicines, foods, dyes, or preservatives  pregnant or trying to get pregnant  breast-feeding How should I use this medicine? This medicine is for injection under the skin of your upper leg, stomach area, or upper arm.  You will be taught how to prepare and give this medicine. Use exactly as directed. Take your medicine at regular intervals. Do not take it more often than directed. This drug comes with INSTRUCTIONS FOR USE. Ask your pharmacist for directions on how to use this drug. Read the information carefully. Talk to your pharmacist or health care provider if you have questions. It is important that you put your used needles and syringes in a special sharps container. Do not put them in a trash can. If you do not have a sharps container, call your pharmacist or healthcare provider to get one. A special MedGuide will be given to you by the pharmacist with each prescription and refill. Be sure to read this information carefully each time. Talk to your pediatrician regarding the use of this medicine in children. Special care may be needed. Overdosage: If you think you have taken too much of this medicine contact a poison control center or emergency room at once. NOTE: This medicine is only for you. Do not share this medicine with others. What if I miss a dose? If you miss a dose, take it as soon as you can. If it is almost time for your next dose, take only that dose. Do not take double or extra doses. If you miss your dose for 3 days or more, call your doctor or health care professional to talk about how to restart this medicine. What may interact with this medicine?  insulin and other medicines for  diabetes This list may not describe all possible interactions. Give your health care provider a list of all the medicines, herbs, non-prescription drugs, or dietary supplements you use. Also tell them if you smoke, drink alcohol, or use illegal drugs. Some items may interact with your medicine. What should I watch for while using this medicine? Visit your doctor or health care professional for regular checks on your progress. Drink plenty of fluids while taking this medicine. Check with your doctor or health care  professional if you get an attack of severe diarrhea, nausea, and vomiting. The loss of too much body fluid can make it dangerous for you to take this medicine. This medicine may affect blood sugar levels. Ask your healthcare provider if changes in diet or medicines are needed if you have diabetes. Patients and their families should watch out for worsening depression or thoughts of suicide. Also watch out for sudden changes in feelings such as feeling anxious, agitated, panicky, irritable, hostile, aggressive, impulsive, severely restless, overly excited and hyperactive, or not being able to sleep. If this happens, especially at the beginning of treatment or after a change in dose, call your health care professional. Women should inform their health care provider if they wish to become pregnant or think they might be pregnant. Losing weight while pregnant is not advised and may cause harm to the unborn child. Talk to your health care provider for more information. What side effects may I notice from receiving this medicine? Side effects that you should report to your doctor or health care professional as soon as possible:  allergic reactions like skin rash, itching or hives, swelling of the face, lips, or tongue  breathing problems  diarrhea that continues or is severe  lump or swelling on the neck  severe nausea  signs and symptoms of infection like fever or chills; cough; sore throat; pain or trouble passing urine  signs and symptoms of low blood sugar such as feeling anxious; confusion; dizziness; increased hunger; unusually weak or tired; increased sweating; shakiness; cold, clammy skin; irritable; headache; blurred vision; fast heartbeat; loss of consciousness  signs and symptoms of kidney injury like trouble passing urine or change in the amount of urine  trouble swallowing  unusual stomach upset or pain  vomiting Side effects that usually do not require medical attention (report to  your doctor or health care professional if they continue or are bothersome):  constipation  decreased appetite  diarrhea  fatigue  headache  nausea  pain, redness, or irritation at site where injected  stomach upset  stuffy or runny nose This list may not describe all possible side effects. Call your doctor for medical advice about side effects. You may report side effects to FDA at 1-800-FDA-1088. Where should I keep my medicine? Keep out of the reach of children. Store unopened pen in a refrigerator between 2 and 8 degrees C (36 and 46 degrees F). Do not freeze or use if the medicine has been frozen. Protect from light and excessive heat. After you first use the pen, it can be stored at room temperature between 15 and 30 degrees C (59 and 86 degrees F) or in a refrigerator. Throw away your used pen after 30 days or after the expiration date, whichever comes first. Do not store your pen with the needle attached. If the needle is left on, medicine may leak from the pen. NOTE: This sheet is a summary. It may not cover all possible information. If you have questions about  this medicine, talk to your doctor, pharmacist, or health care provider.  2020 Elsevier/Gold Standard (2019-01-31 21:16:59)   Who Qualifies for Obesity Medications? Although everyone is hopeful for a fast and easy way to lose weight, nothing has been shown to replace a prudent, calorie-controlled diet along with behavior modification as a cornerstone for all obesity treatments.   The next tool that can be used to achieve weight-loss and health improvement is medication.   Pharmacotherapy may be offered to Individuals affected by obesity who have failed to achieve weight-loss through diet and exercise alone.  Currently there are several drugs that are approved by the FDA for weight-loss: . phentermine products (Adipex-P or Suprenza)  . phentermine- topiramate ER (Qsymia)  . Bupropion; Naltrexone ER (Contrave)   . Saxenda (liraglutide), once a day weight loss shot .  Let's take a closer look at each of these medications and learn how they work:   Phentermine-Topiramate ER (Qsymia) How does it work? This combination medication was approved by the FDA in July 2012. Topiramate is a medication used to treat seizures. It was found that a common side effect of this medication was weight-loss. Phentermine, as described in this brochure, helps to increase your energy and decrease your appetite.  Concerns: The most common side effects were dry mouth, constipation and pins-and-needle feeling in extremities.  Qsymia should NOT be taken during pregnancy since Topiramate ER, a component of Qsymia, has been associated with an increased risk of birth defects.  MEDIATIONS SEPARATED: PHENTERMINE How does it work? Phentermine is a medication available by prescription that works on chemicals in the brain to decrease your appetite. It also has a mild stimulant component that adds extra energy. Phentermine is a pill that is taken once a day in the morning time.   Tolerance to this medication normally develops, so it can only be used for several months at a time.  Common side effects are dry mouth, sleeplessness, constipation.  Concerns: Due to its stimulant effect, a person's blood pressure and heart rate may increase when on this medication; therefore, you must be monitored closely by a physician who is experienced in prescribing this medication. It cannot be used in patients with some heart conditions (such as poorly controlled blood pressure), glaucoma (increased pressure in your eye), stroke or overactive thyroid. There is some concern for abuse, but this is minimal if the medication is appropriately used as directed by a healthcare professional.  TOPIRAMATE Sometimes we will prescribe topiramate AND phentermine together to make Qsymia- separating the medications makes it cheaper.  How to start it start on 1/2  pill for 3-5 nights, can increase to a whole pill for 1-2 weeks.  This medication is good for weight loss, headaches, pain This medication can cause numbness, tingling and can cause brain fog- stop if you get these Check with your eye doctor if you have a history of glaucoma and stop if you severe vision changes or blurry vision.    Bupropion; Naltrexone ER (Contrave) How does it work? Works in two areas of your brain, hunger center and reward center to reduce hunger and cravings.   Concerns Most common side effects are dry mouth, constipation or diarrhea, headache.  Please take it with a full glass of water and low fat meal.   MEDICATIONS SEPARATED: WELLBUTRIN Only antidepressant that helps with weight loss Used frequently for seasonal effective disorder If you get nausea and HA after the first week please stop- can cause people to clench their jaw-  stop it.  If you get anxious or snappy with people than stop the medication But this medication can help with energy, weight loss and mood It kicks in about 1-2 weeks And can be stopped quickly  NALTREXONE You can NOT take this medication if you are on an opioid.  It is used to treat patients that have cravings/addiction to alcohol/opioids.  Taken with supper/dinner.   SAXENDA Is an expensive once a day injectable that helps decrease appetite.  You usually have to fail other therapies and show 6 months of trying before we can get it approved.  Can cause nausea.   VYVANSE Is a stimulant that is approved for binge eating disorder.   METFORMIN In order to create energy your cells need insulin and sugar but sometime your cells do not accept the insulin and this can cause increased sugars and decreased energy.  Metformin can help with decrease sugars and help with weight loss.    Follow-up Visits: Patients are given the opportunity to revisit a topic or obtain more information on an area of interest during follow-up visits.  The  frequency of and interval between follow-up visits is determined on a patient-by-patient basis.   Frequent visits (every 3 to 4 weeks) are encouraged until initial weight-loss goals (5 to 10 percent of body weight) are achieved.   At that point, less frequent visits are typically scheduled as needed for individual patients. However, since obesity is considered a chronic life-long problem for many individuals, periodic continual follow up is recommended.  Research has shown that weight-loss as low as 5 percent of initial body weight can lead to favorable improvements in blood pressure, cholesterol, glucose levels and insulin sensitivity. The risk of developing heart disease is reduced the most in patients who have impaired glucose tolerance, type 2 diabetes or high blood pressure.

## 2019-08-02 LAB — CBC WITH DIFFERENTIAL/PLATELET
Absolute Monocytes: 381 cells/uL (ref 200–950)
Basophils Absolute: 28 cells/uL (ref 0–200)
Basophils Relative: 0.5 %
Eosinophils Absolute: 151 cells/uL (ref 15–500)
Eosinophils Relative: 2.7 %
HCT: 43.2 % (ref 35.0–45.0)
Hemoglobin: 14.4 g/dL (ref 11.7–15.5)
Lymphs Abs: 2078 cells/uL (ref 850–3900)
MCH: 30.8 pg (ref 27.0–33.0)
MCHC: 33.3 g/dL (ref 32.0–36.0)
MCV: 92.5 fL (ref 80.0–100.0)
MPV: 11.7 fL (ref 7.5–12.5)
Monocytes Relative: 6.8 %
Neutro Abs: 2962 cells/uL (ref 1500–7800)
Neutrophils Relative %: 52.9 %
Platelets: 249 10*3/uL (ref 140–400)
RBC: 4.67 10*6/uL (ref 3.80–5.10)
RDW: 12.5 % (ref 11.0–15.0)
Total Lymphocyte: 37.1 %
WBC: 5.6 10*3/uL (ref 3.8–10.8)

## 2019-08-02 LAB — HEMOGLOBIN A1C
Hgb A1c MFr Bld: 5.7 % of total Hgb — ABNORMAL HIGH (ref <5.7)
Mean Plasma Glucose: 117 (calc)
eAG (mmol/L): 6.5 (calc)

## 2019-08-02 LAB — COMPLETE METABOLIC PANEL WITH GFR
AG Ratio: 2 (calc) (ref 1.0–2.5)
ALT: 21 U/L (ref 6–29)
AST: 17 U/L (ref 10–35)
Albumin: 4.6 g/dL (ref 3.6–5.1)
Alkaline phosphatase (APISO): 80 U/L (ref 31–125)
BUN: 14 mg/dL (ref 7–25)
CO2: 29 mmol/L (ref 20–32)
Calcium: 9.7 mg/dL (ref 8.6–10.2)
Chloride: 107 mmol/L (ref 98–110)
Creat: 0.79 mg/dL (ref 0.50–1.10)
GFR, Est African American: 103 mL/min/{1.73_m2} (ref >=60)
GFR, Est Non African American: 89 mL/min/{1.73_m2} (ref >=60)
Globulin: 2.3 g/dL (calc) (ref 1.9–3.7)
Glucose, Bld: 101 mg/dL — ABNORMAL HIGH (ref 65–99)
Potassium: 4.9 mmol/L (ref 3.5–5.3)
Sodium: 143 mmol/L (ref 135–146)
Total Bilirubin: 0.3 mg/dL (ref 0.2–1.2)
Total Protein: 6.9 g/dL (ref 6.1–8.1)

## 2019-08-02 LAB — LIPID PANEL
Cholesterol: 251 mg/dL — ABNORMAL HIGH (ref <200)
HDL: 59 mg/dL (ref >=50)
LDL Cholesterol (Calc): 170 mg/dL (calc) — ABNORMAL HIGH
Non-HDL Cholesterol (Calc): 192 mg/dL (calc) — ABNORMAL HIGH (ref <130)
Total CHOL/HDL Ratio: 4.3 (calc) (ref <5.0)
Triglycerides: 99 mg/dL (ref <150)

## 2019-08-02 LAB — TSH: TSH: 1.57 mIU/L

## 2019-08-05 DIAGNOSIS — Z6832 Body mass index (BMI) 32.0-32.9, adult: Secondary | ICD-10-CM

## 2019-08-05 DIAGNOSIS — E782 Mixed hyperlipidemia: Secondary | ICD-10-CM

## 2019-08-05 DIAGNOSIS — I10 Essential (primary) hypertension: Secondary | ICD-10-CM

## 2019-08-05 DIAGNOSIS — E663 Overweight: Secondary | ICD-10-CM

## 2019-08-05 DIAGNOSIS — R7309 Other abnormal glucose: Secondary | ICD-10-CM

## 2019-08-12 MED ORDER — SAXENDA 18 MG/3ML ~~LOC~~ SOPN: 3.0000 mg | PEN_INJECTOR | Freq: Every day | SUBCUTANEOUS | 2 refills | Status: DC

## 2019-08-21 MED ORDER — NOVOFINE 32G X 6 MM MISC: 5 refills | Status: DC

## 2019-09-24 ENCOUNTER — Encounter: Payer: No Typology Code available for payment source | Admitting: Obstetrics and Gynecology

## 2019-09-24 ENCOUNTER — Encounter: Payer: No Typology Code available for payment source | Admitting: Gynecology

## 2019-10-17 NOTE — Telephone Encounter (Signed)
Rx has been faxed to pharmacy @ 4pm

## 2019-10-18 ENCOUNTER — Encounter: Payer: Self-pay | Admitting: Obstetrics and Gynecology

## 2019-10-18 ENCOUNTER — Other Ambulatory Visit: Payer: Self-pay

## 2019-10-18 ENCOUNTER — Ambulatory Visit (INDEPENDENT_AMBULATORY_CARE_PROVIDER_SITE_OTHER): Payer: Managed Care, Other (non HMO) | Admitting: Obstetrics and Gynecology

## 2019-10-18 VITALS — BP 118/76 | Ht 66.0 in | Wt 185.0 lb

## 2019-10-18 DIAGNOSIS — Z87410 Personal history of cervical dysplasia: Secondary | ICD-10-CM

## 2019-10-18 DIAGNOSIS — Z01419 Encounter for gynecological examination (general) (routine) without abnormal findings: Secondary | ICD-10-CM | POA: Diagnosis not present

## 2019-10-18 NOTE — Progress Notes (Signed)
Kathryn Larsen 16-Aug-1970 [id number]  SUBJECTIVE:  49 y.o. [id number] female here for an annual routine gynecologic exam and Pap smear. She has no gynecologic concerns.  Current Outpatient Medications  Medication Sig Dispense Refill  . aspirin EC 81 MG tablet Take 81 mg by mouth daily.    . Calcium Carb-Cholecalciferol (CALCIUM 1000 + D PO) Take by mouth.    . Cholecalciferol (VITAMIN D PO) Take by mouth daily.    . Cyanocobalamin (B-12 SL) Place under the tongue.    . fexofenadine (ALLEGRA) 180 MG tablet Take 180 mg by mouth daily.    Marland Kitchen ibuprofen (ADVIL,MOTRIN) 200 MG tablet Take 200 mg by mouth every 6 (six) hours as needed for mild pain.    . Insulin Pen Needle (NOVOFINE) 32G X 6 MM MISC Needs daily for saxenda 100 each 5  . Liraglutide -Weight Management (SAXENDA) 18 MG/3ML SOPN Inject 0.5 mLs (3 mg total) into the skin daily. Start 0.6mg  [place] daily x 1 week, then increase dose by 0.6mg  daily every week. Max dose is 3 mg a day. 3 pen 2  . Red Yeast Rice Extract (RED YEAST RICE PO) Take by mouth 2 (two) times daily.     No current facility-administered medications for this visit.   Allergies: Patient has no known allergies.  No LMP recorded. Patient has had a hysterectomy.  Past medical history,surgical history, problem list, medications, allergies, family history and social history were all reviewed and documented as reviewed in the EPIC chart.  ROS:  Feeling well. No dyspnea or chest pain on exertion.  No abdominal pain, change in bowel habits, black or bloody stools.  No urinary tract symptoms. GYN ROS: no abnormal bleeding, pelvic pain or discharge, no breast pain or new or enlarging lumps on self exam. No neurological complaints.   OBJECTIVE:  BP 118/76   Ht 5\' 6"  (1.676 m)   Wt 185 lb (83.9 kg)   BMI 29.86 kg/m  The patient appears well, alert, oriented x 3, in no distress. ENT normal.  Neck supple. No cervical or supraclavicular adenopathy or thyromegaly.  Lungs are  clear, good air entry, no wheezes, rhonchi or rales. S1 and S2 normal, no murmurs, regular rate and rhythm.  Abdomen soft without tenderness, guarding, mass or organomegaly.  Neurological is normal, no focal findings.  BREAST EXAM: breasts appear normal, no suspicious masses, no skin or nipple changes or axillary nodes  PELVIC EXAM: VULVA: normal appearing vulva with no masses, tenderness or lesions, VAGINA: normal appearing vagina with normal color and discharge, no lesions, CERVIX: surgically absent, UTERUS: surgically absent, vaginal cuff normal, ADNEXA: no masses, non tender, PAP: Pap smear done today, thin-prep method  Chaperone: [name] present during the examination  ASSESSMENT:  49 y.o. [id number] here for annual gynecologic exam  PLAN:   1. 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  Her final pathology indicated high-grade dysplasia with clear margins. 2. LGSIL Pap smear 09/2018.  History of high-grade dysplasia noted on her hysterectomy pathology specimen in 2017.  Colposcopy 10/2018 was negative for any visual abnormality, no biopsies taken.  Vaginal cuff Pap smear is repeated today. 3. Mammogram 03/2019.  Normal breast exam today.  Continue with annual mammograms. 4. Health maintenance.  No labs today as she normally has these completed with her primary care provider.  Working on weight loss to address cholesterol elevation which is excellent.  Return annually or sooner,  prn.  Joseph Pierini MD 10/18/19

## 2019-10-18 NOTE — Addendum Note (Signed)
Addended by: Nelva Nay on: 10/18/2019 09:44 AM   Modules accepted: Orders

## 2019-10-21 LAB — PAP IG W/ RFLX HPV ASCU

## 2019-11-06 MED ORDER — WEGOVY 1 MG/0.5ML ~~LOC~~ SOAJ: 1.0000 mg | SUBCUTANEOUS | 0 refills | Status: DC

## 2019-11-07 ENCOUNTER — Other Ambulatory Visit: Payer: Self-pay | Admitting: Physician Assistant

## 2019-11-07 DIAGNOSIS — I10 Essential (primary) hypertension: Secondary | ICD-10-CM

## 2019-11-07 DIAGNOSIS — E663 Overweight: Secondary | ICD-10-CM

## 2019-11-07 DIAGNOSIS — Z6832 Body mass index (BMI) 32.0-32.9, adult: Secondary | ICD-10-CM

## 2019-11-07 DIAGNOSIS — E782 Mixed hyperlipidemia: Secondary | ICD-10-CM

## 2019-11-07 DIAGNOSIS — R7309 Other abnormal glucose: Secondary | ICD-10-CM

## 2019-11-15 ENCOUNTER — Ambulatory Visit: Payer: Managed Care, Other (non HMO) | Admitting: Obstetrics and Gynecology

## 2019-11-15 ENCOUNTER — Encounter: Payer: Self-pay | Admitting: Obstetrics and Gynecology

## 2019-11-15 ENCOUNTER — Other Ambulatory Visit: Payer: Self-pay

## 2019-11-15 VITALS — BP 126/82

## 2019-11-15 DIAGNOSIS — Z87410 Personal history of cervical dysplasia: Secondary | ICD-10-CM | POA: Diagnosis not present

## 2019-11-15 DIAGNOSIS — R87622 Low grade squamous intraepithelial lesion on cytologic smear of vagina (LGSIL): Secondary | ICD-10-CM

## 2019-11-15 NOTE — Progress Notes (Signed)
   ABEERA FLANNERY 28-Jan-1971 [id number]  SUBJECTIVE:  49 y.o. [id number] female presents for vaginal colposcopy.  History of LAVH, bilateral salpingectomy in 2017 for pelvic pain and endometrial polyp with hematometra.  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  Per previous note, the final pathology from hysterectomy specimen showed high-grade dysplasia with clear margins.  First Pap smear 2018 after hysterectomy was negative.  2020 and 2021 Pap smear returned LGSIL.  Colposcopy last year was negative and no biopsies were taken.   OBJECTIVE:  BP 126/82   Graves speculum was placed in the vagina, vaginal cuff and apices were visualized, dilute acetic acid cleanse was applied along with Lugol's.  No abnormalities were seen.  The speculum was turned 90 degrees and this process was repeated to view the anterior and posterior vaginal walls.  No abnormalities were seen.  No biopsies were taken today.  Chaperone: Britt Bottom present during the examination  ASSESSMENT:  49 y.o. [id number] here for colposcopy due to LGSIL 2 on Pap smear x2 years, history of high-grade dysplasia status post hysterectomy.  PLAN:  We reviewed the colposcopy today was normal and there is excellent visualization of the vaginal cuff and cavity.  This is reassuring.  Pete Pap smear again next year at the annual exam.  Joseph Pierini MD 11/15/19

## 2019-12-01 MED ORDER — WEGOVY 1.7 MG/0.75ML ~~LOC~~ SOAJ: 1.7000 mg | SUBCUTANEOUS | 0 refills | Status: DC

## 2019-12-01 NOTE — Addendum Note (Signed)
Addended by: Vicie Mutters R on: 12/01/2019 01:19 PM   Modules accepted: Orders

## 2019-12-02 MED ORDER — WEGOVY 2.4 MG/0.75ML ~~LOC~~ SOAJ: 2.4000 mg | SUBCUTANEOUS | 3 refills | Status: DC

## 2019-12-02 MED ORDER — WEGOVY 1.7 MG/0.75ML ~~LOC~~ SOAJ: 1.7000 mg | SUBCUTANEOUS | 0 refills | Status: DC

## 2019-12-02 NOTE — Addendum Note (Signed)
Addended by: Vicie Mutters R on: 12/02/2019 12:56 PM   Modules accepted: Orders

## 2019-12-02 NOTE — Addendum Note (Signed)
Addended by: Vicie Mutters R on: 12/02/2019 12:53 PM   Modules accepted: Orders

## 2020-01-09 MED ORDER — WEGOVY 2.4 MG/0.75ML ~~LOC~~ SOAJ: 2.4000 mg | SUBCUTANEOUS | 3 refills | Status: DC

## 2020-01-23 ENCOUNTER — Encounter: Payer: PRIVATE HEALTH INSURANCE | Admitting: Physician Assistant

## 2020-01-27 DIAGNOSIS — E785 Hyperlipidemia, unspecified: Secondary | ICD-10-CM | POA: Insufficient documentation

## 2020-01-27 NOTE — Progress Notes (Deleted)
Complete Physical  Assessment and Plan:  Routine general medical examination at a health care facility 1 YEAR  Gastroesophageal reflux disease without esophagitis Continue PPI/H2 blocker, diet discussed  Eczema, unspecified type Monitor  PFO (patent foramen ovale) Monitor ***  Mixed hyperlipidemia check lipids decrease fatty foods increase activity.  -     Lipid panel -     TSH  Prediabetes Discussed disease and risks Discussed diet/exercise, weight management       -     Hemoglobin A1c  Overweight Long discussion about weight loss, diet, and exercise Recommended diet heavy in fruits and veggies and low in animal meats, cheeses, and dairy products, appropriate calorie intake Patient is doing well with wegovy *** Discussed appropriate weight for height *** Follow up at next visit  Vitamin D deficiency -     VITAMIN D 25 Hydroxy (Vit-D Deficiency, Fractures)  Medication management -     Magnesium -     CBC -     CMP/GFR  Screening for blood or protein in urine -     Urinalysis, Routine w reflex microscopic  Screening for thyroid disorder -     TSH   Discussed med's effects and SE's. Screening labs and tests as requested with regular follow-up as recommended. Over 40 minutes of exam, counseling, chart review, and complex, high level critical decision making was performed this visit.   HPI  49 y.o. female  presents for a complete physical and follow up for has Acid reflux; Eczema; PFO (patent foramen ovale); and Abnormal glucose on their problem list..  Gradated FNP, started work [employer] in [employer] in Jan.  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   She is followed by GYN Dr. Delilah Shan; she has hx of HPI+, anormal PAP, recent 10/2019 showed [facility] recommended colposcopy ***  BMI is There is no height or weight on file to calculate BMI., she admits to eating a lot of candy. She was on phentermine, topamax, now on  wegovy  Wt Readings from Last 3 Encounters:  10/18/19 185 lb (83.9 kg)  08/01/19 202 lb (91.6 kg)  02/26/19 195 lb (88.5 kg)    Hx of PFO on ECHO in 2010; *** Her blood pressure {HAS HAS NOT:18834} been controlled at home, today their BP is    She {DOES_DOES HUD:14970} workout. She denies chest pain, shortness of breath, dizziness.  She is not on cholesterol medication (RYRS only) and denies myalgias. Her cholesterol is not at goal. The cholesterol last visit was atypically elevated:   Lab Results  Component Value Date   CHOL 251 (H) 08/01/2019   HDL 59 08/01/2019   LDLCALC 170 (H) 08/01/2019   TRIG 99 08/01/2019   CHOLHDL 4.3 08/01/2019    She {Has/has not:18111} been working on diet and exercise for prediabetes, and denies {Symptoms; diabetes w/o none:19199}. Last A1C in the office was:  Lab Results  Component Value Date   HGBA1C 5.7 (H) 08/01/2019   Patient is on Vitamin D supplement, 5000 IU.   Lab Results  Component Value Date   VD25OH 56 01/28/2019       Lab Results  Component Value Date   WBC 5.6 08/01/2019   HGB 14.4 08/01/2019   HCT 43.2 08/01/2019   MCV 92.5 08/01/2019   PLT 249 08/01/2019   Lab Results  Component Value Date   IRON 138 07/11/2016   TIBC 381 07/11/2016   FERRITIN 44 07/11/2016   ***  on B12 supplement *** Lab Results  Component Value Date   QQIWLNLG92 119 07/11/2016    Current Medications:  Current Outpatient Medications on File Prior to Visit  Medication Sig Dispense Refill  . aspirin EC 81 MG tablet Take 81 mg by mouth daily.    . Calcium Carb-Cholecalciferol (CALCIUM 1000 + D PO) Take by mouth.    . Cholecalciferol (VITAMIN D PO) Take by mouth daily.    . Cyanocobalamin (B-12 SL) Place under the tongue.    . fexofenadine (ALLEGRA) 180 MG tablet Take 180 mg by mouth daily.    Marland Kitchen ibuprofen (ADVIL,MOTRIN) 200 MG tablet Take 200 mg by mouth every 6 (six) hours as needed for mild pain.    . Insulin Pen Needle (NOVOFINE) 32G X 6 MM MISC  Needs daily for saxenda 100 each 5  . Red Yeast Rice Extract (RED YEAST RICE PO) Take by mouth 2 (two) times daily.    . Semaglutide-Weight Management (WEGOVY) 2.4 MG/0.75ML SOAJ Inject 2.4 mg into the skin once a week. 3 mL 3   No current facility-administered medications on file prior to visit.   Allergies:  No Known Allergies   Medical History:  She has Acid reflux; Eczema; PFO (patent foramen ovale); and Abnormal glucose on their problem list.   Health Maintenance:   Immunization History  Administered Date(s) Administered  . Influenza Inj Mdck Quad With Preservative 12/29/2016  . Influenza Split 01/31/2011, 01/27/2019  . PFIZER SARS-COV-2 Vaccination 04/26/2019, 05/16/2019  . PPD Test 12/29/2016, 12/19/2017  . Tdap 07/11/2016    Tetanus: 2018 Pneumovax: N/A Prevnar 13: N/A Flu vaccine: 2020 at work Zostavax: N/A Covid 19: 2/2, 2021, pfizer   LMP: s/p hysterectomy Pap: 10/2019- at GYN, [facility], recommended colposcopy by Dr. Delilah Shan *** MGM:03/2019 DEXA:N/A  Colonoscopy: N/A EGD: N/A  Last Dental Exam: Dr. ziggler Last Eye Exam: Dr. Katy Fitch- going to see him, has OV next week. .  Patient Care Team: Delfina Redwood as PCP - General (Physician Assistant)  Surgical History:  She has a past surgical history that includes Cesarean section; Endometrial ablation (04/2005); Colposcopy; Dental surgery; doppler echocardiography; Upper gi endoscopy; Dilatation & curettage/hysteroscopy with myosure (N/A, 07/07/2015); Laparoscopic vaginal hysterectomy with salpingectomy (Bilateral, 09/15/2015); and Cystoscopy (09/15/2015). Family History:  Herfamily history includes COPD in her father; Cancer in her maternal aunt; Diabetes in her paternal grandmother; Heart disease in her paternal grandmother; Hyperlipidemia in her mother; Hypertension in her mother and paternal grandmother; Myopathy in her mother. Social History:  She reports that she quit smoking about 14 years ago. Her smoking use  included cigarettes. She has a 30.00 pack-year smoking history. She has never used smokeless tobacco. She reports current alcohol use of about 1.0 standard drink of alcohol per week. She reports that she does not use drugs.  Review of Systems: Review of Systems  Constitutional: Negative for malaise/fatigue and weight loss.  HENT: Negative for hearing loss and tinnitus.   Eyes: Negative for blurred vision and double vision.  Respiratory: Negative for cough, sputum production, shortness of breath and wheezing.   Cardiovascular: Negative for chest pain, palpitations, orthopnea, claudication, leg swelling and PND.  Gastrointestinal: Negative for abdominal pain, blood in stool, constipation, diarrhea, heartburn, melena, nausea and vomiting.  Genitourinary: Negative.   Musculoskeletal: Negative for joint pain and myalgias.  Skin: Negative for rash.  Neurological: Negative for dizziness, tingling, sensory change, weakness and headaches.  Endo/Heme/Allergies: Negative for polydipsia.  Psychiatric/Behavioral: Negative.  Negative for depression, memory loss, substance abuse  and suicidal ideas. The patient is not nervous/anxious and does not have insomnia.   All other systems reviewed and are negative.   Physical Exam: Estimated body mass index is 29.86 kg/m as calculated from the following:   Height as of 10/18/19: 5\' 6"  (1.676 m).   Weight as of 10/18/19: 185 lb (83.9 kg). There were no vitals taken for this visit. General Appearance: Well nourished, in no apparent distress.  Eyes: PERRLA, EOMs, conjunctiva no swelling or erythema, normal fundi and vessels.  Sinuses: No Frontal/maxillary tenderness  ENT/Mouth: Ext aud canals clear, normal light reflex with TMs without erythema, bulging. Good dentition. No erythema, swelling, or exudate on post pharynx. Tonsils not swollen or erythematous. Hearing normal.  Neck: Supple, thyroid normal. No bruits  Respiratory: Respiratory effort normal, BS equal  bilaterally without rales, rhonchi, wheezing or stridor.  Cardio: RRR without murmurs, rubs or gallops. Brisk peripheral pulses without edema. *** Chest: symmetric, with normal excursions and percussion.  Breasts: defer Abdomen: Soft, nontender, no guarding, rebound, hernias, masses, or organomegaly.  Lymphatics: Non tender without lymphadenopathy.  Genitourinary: defer Musculoskeletal: Full ROM all peripheral extremities,5/5 strength, and normal gait.  Skin: Warm, dry without rashes, lesions, ecchymosis. Neuro: Cranial nerves intact, reflexes equal bilaterally. Normal muscle tone, no cerebellar symptoms. Sensation intact.  Psych: Awake and oriented X 3, normal affect, Insight and Judgment appropriate.   EKG: declines *** no baseline? *** AORTA SCAN: declines   Izora Ribas 7:34 PM [facility]

## 2020-01-29 ENCOUNTER — Encounter: Payer: Managed Care, Other (non HMO) | Admitting: Adult Health

## 2020-01-29 DIAGNOSIS — Z1329 Encounter for screening for other suspected endocrine disorder: Secondary | ICD-10-CM

## 2020-01-29 DIAGNOSIS — E663 Overweight: Secondary | ICD-10-CM

## 2020-01-29 DIAGNOSIS — E785 Hyperlipidemia, unspecified: Secondary | ICD-10-CM

## 2020-01-29 DIAGNOSIS — Z1389 Encounter for screening for other disorder: Secondary | ICD-10-CM

## 2020-01-29 DIAGNOSIS — R7309 Other abnormal glucose: Secondary | ICD-10-CM

## 2020-01-29 DIAGNOSIS — Z79899 Other long term (current) drug therapy: Secondary | ICD-10-CM

## 2020-01-29 DIAGNOSIS — K219 Gastro-esophageal reflux disease without esophagitis: Secondary | ICD-10-CM

## 2020-01-29 DIAGNOSIS — Q211 Atrial septal defect: Secondary | ICD-10-CM

## 2020-01-29 DIAGNOSIS — Z0000 Encounter for general adult medical examination without abnormal findings: Secondary | ICD-10-CM

## 2020-01-29 DIAGNOSIS — L309 Dermatitis, unspecified: Secondary | ICD-10-CM

## 2020-03-10 ENCOUNTER — Other Ambulatory Visit: Payer: Self-pay | Admitting: Physician Assistant

## 2020-03-10 DIAGNOSIS — Z1231 Encounter for screening mammogram for malignant neoplasm of breast: Secondary | ICD-10-CM

## 2020-03-20 ENCOUNTER — Other Ambulatory Visit: Payer: Self-pay

## 2020-03-20 ENCOUNTER — Ambulatory Visit
Admission: RE | Admit: 2020-03-20 | Discharge: 2020-03-20 | Disposition: A | Discharge status: Home / Self Care | Payer: Managed Care, Other (non HMO) | Source: Ambulatory Visit

## 2020-03-20 DIAGNOSIS — Z1231 Encounter for screening mammogram for malignant neoplasm of breast: Secondary | ICD-10-CM

## 2020-04-24 ENCOUNTER — Encounter: Payer: Self-pay | Admitting: Adult Health

## 2020-04-24 ENCOUNTER — Ambulatory Visit (INDEPENDENT_AMBULATORY_CARE_PROVIDER_SITE_OTHER): Payer: Managed Care, Other (non HMO) | Admitting: Adult Health

## 2020-04-24 ENCOUNTER — Other Ambulatory Visit: Payer: Self-pay

## 2020-04-24 VITALS — BP 112/74 | HR 76 | Temp 97.9°F | Wt 146.0 lb

## 2020-04-24 DIAGNOSIS — Z79899 Other long term (current) drug therapy: Secondary | ICD-10-CM | POA: Diagnosis not present

## 2020-04-24 DIAGNOSIS — E559 Vitamin D deficiency, unspecified: Secondary | ICD-10-CM

## 2020-04-24 DIAGNOSIS — R7309 Other abnormal glucose: Secondary | ICD-10-CM

## 2020-04-24 DIAGNOSIS — E785 Hyperlipidemia, unspecified: Secondary | ICD-10-CM

## 2020-04-24 DIAGNOSIS — Z8639 Personal history of other endocrine, nutritional and metabolic disease: Secondary | ICD-10-CM

## 2020-04-24 DIAGNOSIS — Z6823 Body mass index (BMI) 23.0-23.9, adult: Secondary | ICD-10-CM

## 2020-04-24 MED ORDER — WEGOVY 2.4 MG/0.75ML ~~LOC~~ SOAJ: 2.4000 mg | SUBCUTANEOUS | 5 refills | Status: DC

## 2020-04-24 NOTE — Progress Notes (Signed)
FOLLOW UP  Assessment and Plan:   Mixed hyperlipidemia check lipids, working aggressively on lifestyle decrease fatty foods increase activity.   Vitamin D deficiency Continue supplementation for goal of 60-100  Check vitamin D level  BMi 23 - hx of obesity with comorbidity Currently doing well with wegovy 2.4 mg weekly  Down 56 lb since starting, peak weight 202 lb in 07/2019 Plan to continue wegovy for maintenance for an additional 6-12 months after weight stabilizes The requested drug is being used as an adjunct to lifestyle modification (e.g., dietary or caloric restriction, exercise, behavioral support, community based program). The patient had engaged in > 6 months of lifestyle modification and failed to achieve desired weight loss, Had tried phetnermine and topamax, with insufficient benefit. The patient had BMI greater than or equal to 30 kilograms / square meter with additional disorder related to weight (high cholesterol, hypertension, diabetes, or sleep apnea).     Prediabetes Discussed disease and risks Discussed diet/exercise, weight management  A1C  Orders Placed This Encounter  Procedures  . CBC with Differential/Platelet  . COMPLETE METABOLIC PANEL WITH GFR  . Lipid panel  . Hemoglobin A1c  . VITAMIN D 25 Hydroxy (Vit-D Deficiency, Fractures)  . TSH    Discussed med's effects and SE's. Screening labs and tests as requested with regular follow-up as recommended. Over 30 minutes of exam, counseling, chart review, and complex, high level critical decision making was performed this visit.  Future Appointments  Date Time Provider [place]  10/26/2020  9:00 AM Liane Comber, NP [facility] None  10/30/2020  9:00 AM Joseph Pierini, MD [facility] None    HPI  50 y.o. female  presents for a follow up for has Overweight (BMI 25.0-29.9); Acid reflux; Eczema; PFO (patent foramen ovale); Abnormal glucose (prediabetes); Hyperlipidemia; and Vitamin D deficiency on  their problem list..  Gradated FNP, going to work [employer] life with [employer] is back from [place], will be in [place] for 2 years, and Cheri Rous now doing cyber security   BMI is Body mass index is 23.57 kg/m.,  Failed phentermine, topamax, lifestyle, now on wegovy with excellent progress; she is down 56 lb Started peak weight of 202 lb, BMI 32.6 on 08/01/2019 she feels diet has been going very well  Doing meal/snack prep on the weekend, doing lean proteins, doing lots of veggies and salad Doing fruit and greek yogurt for snacking  About to start with a trainer at work twice a week  Does have mild diarrhea/constipation that alternates, mild nausea but tolerable Has noted fatigue improved Fluid intake: 60-70 of water, coffee in AM, rare diet  Wt Readings from Last 3 Encounters:  04/24/20 146 lb (66.2 kg)  10/18/19 185 lb (83.9 kg)  08/01/19 202 lb (91.6 kg)   Her blood pressure has been controlled at home, today their BP is BP: 112/74 She does workout, trying to with work and school. She denies chest pain, shortness of breath, dizziness.   She is not on cholesterol medication, taking RYRS and denies myalgias. She is working on [employer] loss/lifestyle. Her cholesterol is not at goal. The cholesterol last visit was:   Lab Results  Component Value Date   CHOL 251 (H) 08/01/2019   HDL 59 08/01/2019   LDLCALC 170 (H) 08/01/2019   TRIG 99 08/01/2019   CHOLHDL 4.3 08/01/2019   Last A1C  Lab Results  Component Value Date   HGBA1C 5.7 (H) 08/01/2019   Patient is on Vitamin D supplement,  taking 5000 IU daily    Lab Results  Component Value Date   VD25OH 56 01/28/2019       Current Medications:     Current Outpatient Medications (Respiratory):  .  fexofenadine (ALLEGRA) 180 MG tablet, Take 180 mg by mouth daily.  Current Outpatient Medications (Analgesics):  .  aspirin EC 81 MG tablet, Take 81 mg by mouth daily. Marland Kitchen  ibuprofen (ADVIL,MOTRIN) 200 MG tablet, Take 200 mg by  mouth every 6 (six) hours as needed for mild pain.  Current Outpatient Medications (Hematological):  Marland Kitchen  Cyanocobalamin (B-12 SL), Place under the tongue.  Current Outpatient Medications (Other):  Marland Kitchen  Calcium Carb-Cholecalciferol (CALCIUM 1000 + D PO), Take by mouth. .  Cholecalciferol (VITAMIN D PO), Take by mouth daily. .  Red Yeast Rice Extract (RED YEAST RICE PO), Take by mouth 2 (two) times daily. (Patient not taking: Reported on 04/24/2020) .  Semaglutide-Weight Management (WEGOVY) 2.4 MG/0.75ML SOAJ, Inject 2.4 mg into the skin once a week.  Allergies:  No Known Allergies   Medical History:  She has Overweight (BMI 25.0-29.9); Acid reflux; Eczema; PFO (patent foramen ovale); Abnormal glucose (prediabetes); Hyperlipidemia; and Vitamin D deficiency on their problem list.   Surgical History: reviewed and unchanged Family History: reviewed and unchanged Social History: reviewed and unchanged  Review of Systems: Review of Systems  Constitutional: Negative for malaise/fatigue and weight loss.  HENT: Negative for hearing loss and tinnitus.   Eyes: Negative for blurred vision and double vision.  Respiratory: Negative for cough, shortness of breath and wheezing.   Cardiovascular: Negative for chest pain, palpitations, orthopnea, claudication and leg swelling.  Gastrointestinal: Positive for nausea (mild). Negative for abdominal pain, blood in stool, constipation, diarrhea, heartburn, melena and vomiting.       Mild intermittent alternating loose stools and constipation with wegovy  Genitourinary: Negative.   Musculoskeletal: Negative for joint pain and myalgias.  Skin: Negative for rash.  Neurological: Negative for dizziness, tingling, sensory change, weakness and headaches.  Endo/Heme/Allergies: Negative for polydipsia.  Psychiatric/Behavioral: Negative.   All other systems reviewed and are negative.   Physical Exam: Estimated body mass index is 23.57 kg/m as calculated from the  following:   Height as of 10/18/19: 5\' 6"  (1.676 m).   Weight as of this encounter: 146 lb (66.2 kg). BP 112/74   Pulse 76   Temp 97.9 F (36.6 C)   Wt 146 lb (66.2 kg)   SpO2 98%   BMI 23.57 kg/m  General Appearance: Well nourished, in no apparent distress.  Eyes: PERRLA, EOMs, conjunctiva no swelling or erythema, normal fundi and vessels.  Sinuses: No Frontal/maxillary tenderness  ENT/Mouth: Ext aud canals clear, normal light reflex with TMs without erythema, bulging. Good dentition. No erythema, swelling, or exudate on post pharynx. Tonsils not swollen or erythematous. Hearing normal.  Neck: Supple, thyroid normal.  Respiratory: Respiratory effort normal, BS equal bilaterally without rales, rhonchi, wheezing or stridor.  Cardio: RRR without murmurs, rubs or gallops. Brisk peripheral pulses without edema.  Chest: symmetric, with normal excursions and percussion.  Abdomen: Soft, nontender, no guarding, rebound, hernias, masses, or organomegaly.  Lymphatics: Non tender without lymphadenopathy.  Musculoskeletal: Full ROM all peripheral extremities, 5/5 strength, and normal gait.  Skin: Warm, dry without rashes, lesions, ecchymosis. Neuro: Cranial nerves intact, Normal muscle tone, no cerebellar symptoms. Sensation intact.  Psych: Awake and oriented X 3, normal affect, Insight and Judgment appropriate.   Gorden Harms Tressie Ragin 10:50 AM [facility]

## 2020-04-25 LAB — COMPLETE METABOLIC PANEL WITH GFR
AG Ratio: 2 (calc) (ref 1.0–2.5)
ALT: 17 U/L (ref 6–29)
AST: 16 U/L (ref 10–35)
Albumin: 4.7 g/dL (ref 3.6–5.1)
Alkaline phosphatase (APISO): 65 U/L (ref 31–125)
BUN: 14 mg/dL (ref 7–25)
CO2: 26 mmol/L (ref 20–32)
Calcium: 10.1 mg/dL (ref 8.6–10.2)
Chloride: 104 mmol/L (ref 98–110)
Creat: 0.83 mg/dL (ref 0.50–1.10)
GFR, Est African American: 97 mL/min/{1.73_m2} (ref >=60)
GFR, Est Non African American: 83 mL/min/{1.73_m2} (ref >=60)
Globulin: 2.3 g/dL (calc) (ref 1.9–3.7)
Glucose, Bld: 89 mg/dL (ref 65–99)
Potassium: 5.3 mmol/L (ref 3.5–5.3)
Sodium: 140 mmol/L (ref 135–146)
Total Bilirubin: 0.7 mg/dL (ref 0.2–1.2)
Total Protein: 7 g/dL (ref 6.1–8.1)

## 2020-04-25 LAB — LIPID PANEL
Cholesterol: 232 mg/dL — ABNORMAL HIGH (ref <200)
HDL: 50 mg/dL (ref >=50)
LDL Cholesterol (Calc): 167 mg/dL (calc) — ABNORMAL HIGH
Non-HDL Cholesterol (Calc): 182 mg/dL (calc) — ABNORMAL HIGH (ref <130)
Total CHOL/HDL Ratio: 4.6 (calc) (ref <5.0)
Triglycerides: 57 mg/dL (ref <150)

## 2020-04-25 LAB — TSH: TSH: 1.29 mIU/L

## 2020-04-25 LAB — CBC WITH DIFFERENTIAL/PLATELET
Absolute Monocytes: 389 cells/uL (ref 200–950)
Basophils Absolute: 30 cells/uL (ref 0–200)
Basophils Relative: 0.5 %
Eosinophils Absolute: 148 cells/uL (ref 15–500)
Eosinophils Relative: 2.5 %
HCT: 43.2 % (ref 35.0–45.0)
Hemoglobin: 14.4 g/dL (ref 11.7–15.5)
Lymphs Abs: 2154 cells/uL (ref 850–3900)
MCH: 30.6 pg (ref 27.0–33.0)
MCHC: 33.3 g/dL (ref 32.0–36.0)
MCV: 91.7 fL (ref 80.0–100.0)
MPV: 12.4 fL (ref 7.5–12.5)
Monocytes Relative: 6.6 %
Neutro Abs: 3180 cells/uL (ref 1500–7800)
Neutrophils Relative %: 53.9 %
Platelets: 249 10*3/uL (ref 140–400)
RBC: 4.71 10*6/uL (ref 3.80–5.10)
RDW: 11.9 % (ref 11.0–15.0)
Total Lymphocyte: 36.5 %
WBC: 5.9 10*3/uL (ref 3.8–10.8)

## 2020-04-25 LAB — VITAMIN D 25 HYDROXY (VIT D DEFICIENCY, FRACTURES): Vit D, 25-Hydroxy: 49 ng/mL (ref 30–100)

## 2020-04-25 LAB — HEMOGLOBIN A1C
Hgb A1c MFr Bld: 5.5 % of total Hgb (ref <5.7)
Mean Plasma Glucose: 111 mg/dL
eAG (mmol/L): 6.2 mmol/L

## 2020-05-04 ENCOUNTER — Other Ambulatory Visit: Payer: Self-pay | Admitting: Adult Health

## 2020-05-04 DIAGNOSIS — Z6832 Body mass index (BMI) 32.0-32.9, adult: Secondary | ICD-10-CM

## 2020-05-04 MED ORDER — WEGOVY 2.4 MG/0.75ML ~~LOC~~ SOAJ: 2.4000 mg | SUBCUTANEOUS | 5 refills | Status: AC

## 2020-06-12 ENCOUNTER — Encounter: Payer: Self-pay | Admitting: Adult Health

## 2020-10-26 ENCOUNTER — Encounter: Payer: Managed Care, Other (non HMO) | Admitting: Adult Health

## 2020-10-30 ENCOUNTER — Encounter: Payer: Managed Care, Other (non HMO) | Admitting: Obstetrics and Gynecology

## 2020-10-30 ENCOUNTER — Ambulatory Visit: Payer: Managed Care, Other (non HMO) | Admitting: Obstetrics & Gynecology

## 2021-01-28 ENCOUNTER — Encounter: Payer: Managed Care, Other (non HMO) | Admitting: Adult Health

## 2021-02-05 ENCOUNTER — Ambulatory Visit: Payer: Managed Care, Other (non HMO) | Admitting: Obstetrics & Gynecology

## 2021-02-18 DIAGNOSIS — R87612 Low grade squamous intraepithelial lesion on cytologic smear of cervix (LGSIL): Secondary | ICD-10-CM | POA: Insufficient documentation

## 2021-02-18 NOTE — Progress Notes (Deleted)
Complete Physical  Assessment and Plan:  Encounter for Annual Physical Exam with abnormal findings Due annually  Health Maintenance reviewed Healthy lifestyle reviewed and goals set ***  Gastroesophageal reflux disease without esophagitis Continue PPI/H2 blocker, diet discussed  Eczema, unspecified type Monitor  PFO (patent foramen ovale) Monitor  Essential hypertension -     CBC with Differential/Platelet -     COMPLETE METABOLIC PANEL WITH GFR -     TSH -     UA *** - continue medications, DASH diet, exercise and monitor at home. Call if greater than 130/80.   Mixed hyperlipidemia -     Lipid panel check lipids decrease fatty foods increase activity.   Vitamin D deficiency -     VITAMIN D 25 Hydroxy (Vit-D Deficiency, Fractures)  Medication management -     Magnesium  Screening for blood or protein in urine -     Urinalysis, Routine w reflex microscopic -     Microalbumin / creatinine urine ratio  Screening for thyroid disorder -     TSH  Overweight -     topiramate (TOPAMAX) 50 MG tablet; Take 1 tablet (50 mg total) by mouth daily. Has failed phentermine, will try topamax - given information about diet - follow up 1 month  Screening for diabetes mellitus -     Hemoglobin A1c  No orders of the defined types were placed in this encounter.   Discussed med's effects and SE's. Screening labs and tests as requested with regular follow-up as recommended. Over 40 minutes of exam, counseling, chart review, and complex, high level critical decision making was performed this visit.   Future Appointments  Date Time Provider [place]  02/19/2021 10:00 AM Liane Comber, NP [facility] None  04/02/2021  8:00 AM Princess Bruins, MD [facility] None     HPI  50 y.o. female  presents for a complete physical and follow up for has Overweight (BMI 25.0-29.9); Acid reflux; Eczema; PFO (patent foramen ovale); Abnormal glucose (prediabetes); Hyperlipidemia; and  Vitamin D deficiency on their problem list..  Gradated FNP, going to work [employer] life with [employer] Done is back from [place], will be in [place] for 2 years, and Cheri Rous now doing Scientist, forensic with GYN annually due to HPV+ and LGSIL ***   BMI is There is no height or weight on file to calculate BMI., she {HAS HAS [id number]} been working on diet and exercise. Failed phentermine, topamax, lifestyle, now on wegovy with excellent progress; she is down 56 lb Started peak weight of 202 lb, BMI 32.6 on 08/01/2019 she feels diet has been going very well  Doing meal/snack prep on the weekend, doing lean proteins, doing lots of veggies and salad Doing fruit and greek yogurt for snacking  About to start with a trainer at work twice a week  Does have mild diarrhea/constipation that alternates, mild nausea but tolerable Has noted fatigue improved Fluid intake: 60-70 of water, coffee in AM, rare diet soda Wt Readings from Last 3 Encounters:  04/24/20 146 lb (66.2 kg)  10/18/19 185 lb (83.9 kg)  08/01/19 202 lb (91.6 kg)   Her blood pressure has been controlled at home, today their BP is   She does workout, trying to with work and school. She denies chest pain, shortness of breath, dizziness.  She is not on cholesterol medication ([facility] only) and denies myalgias. Her cholesterol is not at goal. Has been elevated since 2018, LDL ranging 122-180. The cholesterol  last visit was:   Lab Results  Component Value Date   CHOL 232 (H) 04/24/2020   HDL 50 04/24/2020   LDLCALC 167 (H) 04/24/2020   TRIG 57 04/24/2020   CHOLHDL 4.6 04/24/2020   Patient is on Vitamin D supplement, 5000 IU.   Lab Results  Component Value Date   VD25OH 49 04/24/2020      ***  Lab Results  Component Value Date   CHENIDPO24 235 07/11/2016     Current Medications:  Current Outpatient Medications on File Prior to Visit  Medication Sig Dispense Refill   aspirin EC 81 MG tablet Take 81 mg by mouth  daily.     Calcium Carb-Cholecalciferol (CALCIUM 1000 + D PO) Take by mouth.     Cholecalciferol (VITAMIN D PO) Take by mouth daily.     Cyanocobalamin (B-12 SL) Place under the tongue.     fexofenadine (ALLEGRA) 180 MG tablet Take 180 mg by mouth daily.     ibuprofen (ADVIL,MOTRIN) 200 MG tablet Take 200 mg by mouth every 6 (six) hours as needed for mild pain.     Red Yeast Rice Extract (RED YEAST RICE PO) Take by mouth 2 (two) times daily. (Patient not taking: Reported on 04/24/2020)     Semaglutide-Weight Management (WEGOVY) 2.4 MG/0.75ML SOAJ Inject 2.4 mg into the skin once a week. 3 mL 5   No current facility-administered medications on file prior to visit.   Allergies:  No Known Allergies   Medical History:  She has Overweight (BMI 25.0-29.9); Acid reflux; Eczema; PFO (patent foramen ovale); Abnormal glucose (prediabetes); Hyperlipidemia; and Vitamin D deficiency on their problem list.   Health Maintenance:   Immunization History  Administered Date(s) Administered   Influenza Inj Mdck Quad With Preservative 12/29/2016   Influenza Split 01/31/2011, 01/27/2019   PFIZER(Purple Top)SARS-COV-2 Vaccination 04/26/2019, 05/16/2019, 01/10/2020   PPD Test 12/29/2016, 12/19/2017   Tdap 07/11/2016    Tetanus: 2018 Pneumovax: N/A Prevnar 13: N/A Flu vaccine: 2020 at work Shingrix: dscuss age 71 Covid 72: 2/2 pfizer + booster 01/2020   LMP: s/p hysterectomy *** Pap: 10/18/2019 ***- at GYN, getting **** due to HPV for 10 years MGM: 07/2017 DEXA:N/A ***  Colonoscopy: N/A EGD: N/A  Last Dental Exam: Dr. ziggler Last Eye Exam: Dr. Katy Fitch- going to see him, has OV next week. .  Patient Care Team: Unk Pinto, MD as PCP - General (Internal Medicine) Unk Pinto, MD as Referring Physician (Internal Medicine)  Surgical History:  She has a past surgical history that includes Cesarean section; Endometrial ablation (04/2005); Colposcopy; Dental surgery; doppler echocardiography;  Upper gi endoscopy; Dilatation & curettage/hysteroscopy with myosure (N/A, 07/07/2015); Laparoscopic vaginal hysterectomy with salpingectomy (Bilateral, 09/15/2015); and Cystoscopy (09/15/2015). Family History:  Herfamily history includes COPD in her father; Cancer in her maternal aunt; Diabetes in her paternal grandmother; Heart disease in her paternal grandmother; Hyperlipidemia in her mother; Hypertension in her mother and paternal grandmother; Myopathy in her mother. Social History:  She reports that she quit smoking about 15 years ago. Her smoking use included cigarettes. She has a 30.00 pack-year smoking history. She has never used smokeless tobacco. She reports current alcohol use of about 1.0 standard drink per week. She reports that she does not use drugs.  Review of Systems: Review of Systems  Constitutional:  Positive for malaise/fatigue. Negative for chills, diaphoresis, fever and weight loss.  HENT: Negative.    Eyes: Negative.   Respiratory: Negative.    Cardiovascular: Negative.   Gastrointestinal:  Positive  for heartburn. Negative for abdominal pain, blood in stool, constipation, diarrhea, melena, nausea and vomiting.  Genitourinary: Negative.        Vaginal dryness  Musculoskeletal: Negative.   Skin: Negative.   Neurological:  Negative for weakness.   Physical Exam: Estimated body mass index is 23.57 kg/m as calculated from the following:   Height as of 10/18/19: 5\' 6"  (1.676 m).   Weight as of 04/24/20: 146 lb (66.2 kg). There were no vitals taken for this visit. General Appearance: Well nourished, in no apparent distress.  Eyes: PERRLA, EOMs, conjunctiva no swelling or erythema, normal fundi and vessels.  Sinuses: No Frontal/maxillary tenderness  ENT/Mouth: Ext aud canals clear, normal light reflex with TMs without erythema, bulging. Good dentition. No erythema, swelling, or exudate on post pharynx. Tonsils not swollen or erythematous. Hearing normal.  Neck: Supple, thyroid  normal. No bruits  Respiratory: Respiratory effort normal, BS equal bilaterally without rales, rhonchi, wheezing or stridor.  Cardio: RRR without murmurs, rubs or gallops. Brisk peripheral pulses without edema.  Chest: symmetric, with normal excursions and percussion.  Breasts: defer Abdomen: Soft, nontender, no guarding, rebound, hernias, masses, or organomegaly.  Lymphatics: Non tender without lymphadenopathy.  Genitourinary: defer Musculoskeletal: Full ROM all peripheral extremities,5/5 strength, and normal gait.  Skin: Warm, dry without rashes, lesions, ecchymosis. Neuro: Cranial nerves intact, reflexes equal bilaterally. Normal muscle tone, no cerebellar symptoms. Sensation intact.  Psych: Awake and oriented X 3, normal affect, Insight and Judgment appropriate.   EKG: declines AORTA SCAN: declines   Gorden Harms Zailen Albarran 2:14 PM [facility]

## 2021-02-19 ENCOUNTER — Encounter: Payer: Managed Care, Other (non HMO) | Admitting: Adult Health

## 2021-03-18 ENCOUNTER — Other Ambulatory Visit: Payer: Self-pay | Admitting: Adult Health

## 2021-03-18 DIAGNOSIS — Z1231 Encounter for screening mammogram for malignant neoplasm of breast: Secondary | ICD-10-CM

## 2021-04-02 ENCOUNTER — Other Ambulatory Visit (HOSPITAL_COMMUNITY)
Admission: RE | Admit: 2021-04-02 | Discharge: 2021-04-02 | Disposition: A | Discharge status: Home / Self Care | Payer: Managed Care, Other (non HMO) | Source: Ambulatory Visit | Attending: Obstetrics & Gynecology | Admitting: Obstetrics & Gynecology

## 2021-04-02 ENCOUNTER — Encounter: Payer: Self-pay | Admitting: Obstetrics & Gynecology

## 2021-04-02 ENCOUNTER — Ambulatory Visit (INDEPENDENT_AMBULATORY_CARE_PROVIDER_SITE_OTHER): Payer: Managed Care, Other (non HMO) | Admitting: Obstetrics & Gynecology

## 2021-04-02 ENCOUNTER — Other Ambulatory Visit: Payer: Self-pay

## 2021-04-02 VITALS — BP 94/68 | HR 80 | Resp 16 | Ht 66.0 in | Wt 132.0 lb

## 2021-04-02 DIAGNOSIS — R300 Dysuria: Secondary | ICD-10-CM | POA: Diagnosis not present

## 2021-04-02 DIAGNOSIS — Z1272 Encounter for screening for malignant neoplasm of vagina: Secondary | ICD-10-CM | POA: Insufficient documentation

## 2021-04-02 DIAGNOSIS — N941 Unspecified dyspareunia: Secondary | ICD-10-CM | POA: Diagnosis not present

## 2021-04-02 DIAGNOSIS — Z87410 Personal history of cervical dysplasia: Secondary | ICD-10-CM

## 2021-04-02 DIAGNOSIS — Z01419 Encounter for gynecological examination (general) (routine) without abnormal findings: Secondary | ICD-10-CM | POA: Diagnosis present

## 2021-04-02 MED ORDER — SULFAMETHOXAZOLE-TRIMETHOPRIM 800-160 MG PO TABS: 1.0000 | ORAL_TABLET | Freq: Two times a day (BID) | ORAL | 0 refills | Status: AC

## 2021-04-02 NOTE — Progress Notes (Signed)
Kathryn Larsen 22-Jul-1970 [id number]   History:    50 y.o. G2P2L2 Married  RP:  Established patient presenting for annual gynecologic exam  HPI:  Prior LAVH and bilateral salpingectomy 2017 for pelvic pain, endometrial polyp, menometrorrhagia and persistent LGSIL with positive high-risk HPV.  Her final pathology indicated high-grade dysplasia with clear margins.  LGSIL Pap smear 09/2018.  History of high-grade dysplasia noted on her hysterectomy pathology specimen in 2017.  Colposcopy 10/2018 was negative for any visual abnormality, no biopsies taken.  Pap Neg in 2021.  Pap reflex today.  No pelvic pain.  Discomfort with IC. Coconut oil recommended. Some dysuria and frequency x a few days. Breasts normal.  Mammogram scheduled 04/16/2021.  Normal breast exam today.  Continue with annual mammograms.  BMI 21.31.  Lost 75 Lbs x last year. Improved diet and fitness.  Health labs with Fam MD.  Will schedule colono in 2023.    Past medical history,surgical history, family history and social history were all reviewed and documented in the EPIC chart.  Gynecologic History No LMP recorded. Patient has had a hysterectomy.  Obstetric History OB History  Gravida Para Term Preterm AB Living  2 2 1 1   2   SAB IAB Ectopic Multiple Live Births               # Outcome Date GA Lbr Len/2nd Weight Sex Delivery Anes PTL Lv  2 Preterm           1 Term              ROS: A ROS was performed and pertinent positives and negatives are included in the history.  GENERAL: No fevers or chills. HEENT: No change in vision, no earache, sore throat or sinus congestion. NECK: No pain or stiffness. CARDIOVASCULAR: No chest pain or pressure. No palpitations. PULMONARY: No shortness of breath, cough or wheeze. GASTROINTESTINAL: No abdominal pain, nausea, vomiting or diarrhea, melena or bright red blood per rectum. GENITOURINARY: No urinary frequency, urgency, hesitancy or dysuria. MUSCULOSKELETAL: No joint or muscle pain, no  back pain, no recent trauma. DERMATOLOGIC: No rash, no itching, no lesions. ENDOCRINE: No polyuria, polydipsia, no heat or cold intolerance. No recent change in weight. HEMATOLOGICAL: No anemia or easy bruising or bleeding. NEUROLOGIC: No headache, seizures, numbness, tingling or weakness. PSYCHIATRIC: No depression, no loss of interest in normal activity or change in sleep pattern.     Exam:   BP 94/68    Pulse 80    Resp 16    Ht 5\' 6"  (1.676 m)    Wt 132 lb (59.9 kg)    BMI 21.31 kg/m   Body mass index is 21.31 kg/m.  General appearance : Well developed well nourished female. No acute distress HEENT: Eyes: no retinal hemorrhage or exudates,  Neck supple, trachea midline, no carotid bruits, no thyroidmegaly Lungs: Clear to auscultation, no rhonchi or wheezes, or rib retractions  Heart: Regular rate and rhythm, no murmurs or gallops Breast:Examined in sitting and supine position were symmetrical in appearance, no palpable masses or tenderness,  no skin retraction, no nipple inversion, no nipple discharge, no skin discoloration, no axillary or supraclavicular lymphadenopathy Abdomen: no palpable masses or tenderness, no rebound or guarding Extremities: no edema or skin discoloration or tenderness  Pelvic: Vulva: Normal             Vagina: No gross lesions or discharge.  Pap reflex done.  Cervix/Uterus absent  Adnexa  Without masses or tenderness  Anus: Normal  U/A: Amber cloudy, protein negative, nitrites negative, white blood cells 0-5, red blood cells negative, bacteria moderate.  Urine culture pending.   Assessment/Plan:  50 y.o. female for annual exam   1. Encounter for Papanicolaou smear of vagina as part of routine gynecological examination Prior LAVH and bilateral salpingectomy 2017 for pelvic pain, endometrial polyp, menometrorrhagia and persistent LGSIL with positive high-risk HPV.  Her final pathology indicated high-grade dysplasia with clear margins.  LGSIL Pap smear  09/2018.  History of high-grade dysplasia noted on her hysterectomy pathology specimen in 2017.  Colposcopy 10/2018 was negative for any visual abnormality, no biopsies taken.  Pap Neg in 2021.  Pap reflex today.  No pelvic pain.  Discomfort with IC. Coconut oil recommended.  Breasts normal.  Mammogram scheduled 04/16/2021.  Normal breast exam today.  Continue with annual mammograms.  BMI 21.31.  Lost 75 Lbs x last year. Improved diet and fitness.  Health labs with Fam MD.  Will schedule colono in 2023. - Cytology - PAP( [place])  2. History of cervical dysplasia - Cytology - PAP( )  3. Dysuria Some dysuria and frequency x a few days.  U/A mildly perturbed.  Urine culture pending.  Decision to treat with Bactrim DS 1 tablet per mouth twice daily for 3 days.  Usage reviewed and prescription sent to pharmacy. - Urinalysis,Complete w/RFL Culture  4. Dyspareunia in female  Recommend Coconut oil.  Princess Bruins MD, 8:21 AM 04/02/2021

## 2021-04-04 LAB — URINALYSIS, COMPLETE W/RFL CULTURE
Glucose, UA: NEGATIVE
Hgb urine dipstick: NEGATIVE
Hyaline Cast: NONE SEEN /LPF
Leukocyte Esterase: NEGATIVE
Nitrites, Initial: NEGATIVE
Protein, ur: NEGATIVE
RBC / HPF: NONE SEEN /HPF (ref 0–2)
Specific Gravity, Urine: 1.02 (ref 1.001–1.035)
pH: 6 (ref 5.0–8.0)

## 2021-04-04 LAB — URINE CULTURE
MICRO NUMBER:: 12795248
Result:: NO GROWTH
SPECIMEN QUALITY:: ADEQUATE

## 2021-04-04 LAB — CULTURE INDICATED

## 2021-04-11 LAB — CYTOLOGY - PAP

## 2021-04-16 DIAGNOSIS — Z1231 Encounter for screening mammogram for malignant neoplasm of breast: Secondary | ICD-10-CM

## 2021-04-19 ENCOUNTER — Other Ambulatory Visit: Payer: Self-pay

## 2021-04-19 DIAGNOSIS — R87622 Low grade squamous intraepithelial lesion on cytologic smear of vagina (LGSIL): Secondary | ICD-10-CM

## 2021-04-19 MED ORDER — FLUCONAZOLE 150 MG PO TABS: 150.0000 mg | ORAL_TABLET | Freq: Once | ORAL | 0 refills | Status: AC

## 2021-04-22 ENCOUNTER — Other Ambulatory Visit: Payer: Self-pay | Admitting: Obstetrics & Gynecology

## 2021-04-22 DIAGNOSIS — Z1231 Encounter for screening mammogram for malignant neoplasm of breast: Secondary | ICD-10-CM

## 2021-04-29 ENCOUNTER — Encounter: Payer: Self-pay | Admitting: Obstetrics & Gynecology

## 2021-04-30 MED ORDER — FLUCONAZOLE 150 MG PO TABS: ORAL_TABLET | ORAL | 1 refills | Status: DC

## 2021-04-30 NOTE — Telephone Encounter (Signed)
Dr.Lavoie replied "Can prescribe Fluconazole 150 mg 1 tab PO daily x 3 days, every week x 3.  #9, refill x 1. "

## 2021-05-05 ENCOUNTER — Other Ambulatory Visit: Payer: Self-pay | Admitting: Family Medicine

## 2021-05-05 DIAGNOSIS — Z1231 Encounter for screening mammogram for malignant neoplasm of breast: Secondary | ICD-10-CM

## 2021-05-10 ENCOUNTER — Encounter: Payer: Self-pay | Admitting: Gastroenterology

## 2021-05-14 DIAGNOSIS — Z1231 Encounter for screening mammogram for malignant neoplasm of breast: Secondary | ICD-10-CM

## 2021-05-28 ENCOUNTER — Ambulatory Visit
Admission: RE | Admit: 2021-05-28 | Discharge: 2021-05-28 | Disposition: A | Discharge status: Home / Self Care | Payer: Managed Care, Other (non HMO) | Source: Ambulatory Visit

## 2021-05-28 DIAGNOSIS — Z1231 Encounter for screening mammogram for malignant neoplasm of breast: Secondary | ICD-10-CM

## 2021-06-01 ENCOUNTER — Other Ambulatory Visit: Payer: Self-pay | Admitting: Obstetrics & Gynecology

## 2021-06-01 ENCOUNTER — Other Ambulatory Visit: Payer: Self-pay | Admitting: Family Medicine

## 2021-06-01 DIAGNOSIS — R928 Other abnormal and inconclusive findings on diagnostic imaging of breast: Secondary | ICD-10-CM

## 2021-06-02 ENCOUNTER — Ambulatory Visit: Payer: Managed Care, Other (non HMO) | Admitting: Obstetrics & Gynecology

## 2021-06-02 ENCOUNTER — Encounter: Payer: Self-pay | Admitting: Obstetrics & Gynecology

## 2021-06-02 ENCOUNTER — Other Ambulatory Visit: Payer: Self-pay

## 2021-06-02 ENCOUNTER — Other Ambulatory Visit (HOSPITAL_COMMUNITY)
Admission: RE | Admit: 2021-06-02 | Discharge: 2021-06-02 | Disposition: A | Discharge status: Home / Self Care | Payer: Managed Care, Other (non HMO) | Source: Ambulatory Visit | Attending: Obstetrics & Gynecology | Admitting: Obstetrics & Gynecology

## 2021-06-02 VITALS — BP 114/74

## 2021-06-02 DIAGNOSIS — Z87410 Personal history of cervical dysplasia: Secondary | ICD-10-CM | POA: Diagnosis present

## 2021-06-02 DIAGNOSIS — Z90710 Acquired absence of both cervix and uterus: Secondary | ICD-10-CM

## 2021-06-02 DIAGNOSIS — R87622 Low grade squamous intraepithelial lesion on cytologic smear of vagina (LGSIL): Secondary | ICD-10-CM

## 2021-06-02 NOTE — Progress Notes (Signed)
° ° °  Kathryn Larsen 10/17/1970 [id number]        51 y.o.  G2P2L2 Married  RP: PAP: 04-02-21 LGSIL for Colposcopy  HPI: PAP 04-02-21 LGSIL. Long h/o LGSIL. S/P LAVH in 09/2015.  Patho: CERVIX: SQUAMOUS CARCINOMA IN SITU; HIGH GRADE SQUAMOUS INTRAEPITHELIAL LESION, CIN-III. NO INVASIVE NEOPLASM IDENTIFIED. MARGINS OF RESECTION ARE NEGATIVE FOR NEOPLASIA   OB History  Gravida Para Term Preterm AB Living  2 2 1 1   2   SAB IAB Ectopic Multiple Live Births               # Outcome Date GA Lbr Len/2nd Weight Sex Delivery Anes PTL Lv  2 Preterm           1 Term             Past medical history,surgical history, problem list, medications, allergies, family history and social history were all reviewed and documented in the EPIC chart.   Directed ROS with pertinent positives and negatives documented in the history of present illness/assessment and plan.  Exam:  Vitals:   06/02/21 1401  BP: 114/74   General appearance:  Normal  Colposcopy Procedure Note [facility] 06/02/2021  Indications:  LGSIL on Vaginal Pap   Procedure Details  The risks and benefits of the procedure and Written informed consent obtained.  Speculum placed in vagina and excellent visualization of the vaginal vault which was swabbed x 3 with acetic acid solution.  Findings:  Cervix absent  Vaginal colposcopy:  Very mild AW/punctation at the left vaginal vault.  Biopsy taken.  Hemostasis with Silver Nitrate.  Vulvar colposcopy: Normal  Perirectal colposcopy: Normal  The cervix was sprayed with Hurricane before performing the cervical biopsies.  Specimens: Left vaginal vault biopsy  Complications: None, good hemostasis with Silver Nitrate . Plan: Management per Vaginal Bx result   Assessment/Plan:  51 y.o. G2P1102   1. LGSIL Pap smear of vagina LGSIL on Vaginal Pap in 03/2021.  Colposcopy procedure explained and findings reviewed.  Post procedure precautions discussed.  Management per Vaginal  Bx result. - Colposcopy - Surgical pathology( [place]/ [facility])  2. History of severe cervical dysplasia - Surgical pathology( [place]/ [facility])  3. S/P laparoscopic assisted vaginal hysterectomy (LAVH)   Princess Bruins MD, 2:13 PM 06/02/2021

## 2021-06-04 ENCOUNTER — Ambulatory Visit (AMBULATORY_SURGERY_CENTER): Payer: Managed Care, Other (non HMO) | Admitting: *Deleted

## 2021-06-04 ENCOUNTER — Other Ambulatory Visit: Payer: Self-pay

## 2021-06-04 VITALS — Ht 66.0 in | Wt 123.0 lb

## 2021-06-04 DIAGNOSIS — Z1211 Encounter for screening for malignant neoplasm of colon: Secondary | ICD-10-CM

## 2021-06-04 LAB — SURGICAL PATHOLOGY

## 2021-06-04 MED ORDER — NA SULFATE-K SULFATE-MG SULF 17.5-3.13-1.6 GM/177ML PO SOLN: 1.0000 | Freq: Once | ORAL | 0 refills | Status: AC

## 2021-06-04 NOTE — Progress Notes (Signed)

## 2021-06-11 ENCOUNTER — Ambulatory Visit
Admission: RE | Admit: 2021-06-11 | Discharge: 2021-06-11 | Disposition: A | Discharge status: Home / Self Care | Payer: Managed Care, Other (non HMO) | Source: Ambulatory Visit | Attending: Family Medicine | Admitting: Family Medicine

## 2021-06-11 ENCOUNTER — Ambulatory Visit: Payer: Managed Care, Other (non HMO)

## 2021-06-11 DIAGNOSIS — R928 Other abnormal and inconclusive findings on diagnostic imaging of breast: Secondary | ICD-10-CM

## 2021-06-18 ENCOUNTER — Encounter: Payer: Managed Care, Other (non HMO) | Admitting: Gastroenterology

## 2021-07-07 ENCOUNTER — Encounter: Payer: Self-pay | Admitting: Gastroenterology

## 2021-07-09 ENCOUNTER — Ambulatory Visit (AMBULATORY_SURGERY_CENTER): Payer: Managed Care, Other (non HMO) | Admitting: Gastroenterology

## 2021-07-09 ENCOUNTER — Encounter: Payer: Self-pay | Admitting: Gastroenterology

## 2021-07-09 VITALS — BP 104/63 | HR 71 | Temp 98.1°F | Resp 12 | Ht 66.0 in | Wt 123.0 lb

## 2021-07-09 DIAGNOSIS — Z1211 Encounter for screening for malignant neoplasm of colon: Secondary | ICD-10-CM | POA: Diagnosis not present

## 2021-07-09 DIAGNOSIS — K640 First degree hemorrhoids: Secondary | ICD-10-CM

## 2021-07-09 MED ORDER — SODIUM CHLORIDE 0.9 % IV SOLN: 500.0000 mL | Freq: Once | INTRAVENOUS | Status: DC

## 2021-07-09 NOTE — Progress Notes (Signed)
Report to PACU, RN, vss, BBS= Clear.  

## 2021-07-09 NOTE — Patient Instructions (Signed)
YOU HAD AN ENDOSCOPIC PROCEDURE TODAY AT THE [facility]:   Refer to the procedure report that was given to you for any specific questions about what was found during the examination.  If the procedure report does not answer your questions, please call your gastroenterologist to clarify.  If you requested that your care partner not be given the details of your procedure findings, then the procedure report has been included in a sealed envelope for you to review at your convenience later.  YOU SHOULD EXPECT: Some feelings of bloating in the abdomen. Passage of more gas than usual.  Walking can help get rid of the air that was put into your GI tract during the procedure and reduce the bloating. If you had a lower endoscopy (such as a colonoscopy or flexible sigmoidoscopy) you may notice spotting of blood in your stool or on the toilet paper. If you underwent a bowel prep for your procedure, you may not have a normal bowel movement for a few days.  Please Note:  You might notice some irritation and congestion in your nose or some drainage.  This is from the oxygen used during your procedure.  There is no need for concern and it should clear up in a day or so.  SYMPTOMS TO REPORT IMMEDIATELY:   Following lower endoscopy (colonoscopy or flexible sigmoidoscopy):  Excessive amounts of blood in the stool  Significant tenderness or worsening of abdominal pains  Swelling of the abdomen that is new, acute  Fever of 100F or higher  For urgent or emergent issues, a gastroenterologist can be reached at any hour by calling [phone]. Do not use MyChart messaging for urgent concerns.    DIET:  We do recommend a small meal at first, but then you may proceed to your regular diet.  Drink plenty of fluids but you should avoid alcoholic beverages for 24 hours.  ACTIVITY:  You should plan to take it easy for the rest of today and you should NOT DRIVE or use heavy machinery until tomorrow (because  of the sedation medicines used during the test).    FOLLOW UP: Our staff will call the number listed on your records 48-72 hours following your procedure to check on you and address any questions or concerns that you may have regarding the information given to you following your procedure. If we do not reach you, we will leave a message.  We will attempt to reach you two times.  During this call, we will ask if you have developed any symptoms of COVID 19. If you develop any symptoms (ie: fever, flu-like symptoms, shortness of breath, cough etc.) before then, please call [phone].  If you test positive for Covid 19 in the 2 weeks post procedure, please call and report this information to us.    If any biopsies were taken you will be contacted by phone or by letter within the next 1-3 weeks.  Please call us at [phone] if you have not heard about the biopsies in 3 weeks.    SIGNATURES/CONFIDENTIALITY: You and/or your care partner have signed paperwork which will be entered into your electronic medical record.  These signatures attest to the fact that that the information above on your After Visit Summary has been reviewed and is understood.  Full responsibility of the confidentiality of this discharge information lies with you and/or your care-partner. 

## 2021-07-09 NOTE — Op Note (Signed)
[place] ?Patient Name: Kathryn Larsen ?Procedure Date: 07/09/2021 10:39 AM ?MRN: [id number] ?Endoscopist: Gerrit Heck , MD ?Age: 51 ?Referring MD:  ?Date of Birth: [birth year] ?Gender: Female ?Account #: [id number] ?Procedure:                Colonoscopy ?Indications:              Screening for colorectal malignant neoplasm, This  ?                          is the patient's first colonoscopy ?                          Family history notable for maternal aunt and  ?                          maternal cousin with Colon Cancer. ?                          She is otherwise without GI symptoms. ?Medicines:                Monitored Anesthesia Care ?Procedure:                Pre-Anesthesia Assessment: ?                          - Prior to the procedure, a History and Physical  ?                          was performed, and patient medications and  ?                          allergies were reviewed. The patient's tolerance of  ?                          previous anesthesia was also reviewed. The risks  ?                          and benefits of the procedure and the sedation  ?                          options and risks were discussed with the patient.  ?                          All questions were answered, and informed consent  ?                          was obtained. Prior Anticoagulants: The patient has  ?                          taken no previous anticoagulant or antiplatelet  ?                          agents. ASA Grade Assessment: II - A patient with  ?  mild systemic disease. After reviewing the risks  ?                          and benefits, the patient was deemed in  ?                          satisfactory condition to undergo the procedure. ?                          After obtaining informed consent, the colonoscope  ?                          was passed under direct vision. Throughout the  ?                          procedure, the patient's blood pressure, pulse, and  ?                           oxygen saturations were monitored continuously. The  ?                          Olympus CF-HQ190L (#[id number]) Colonoscope was  ?                          introduced through the anus and advanced to the the  ?                          terminal ileum. The colonoscopy was performed  ?                          without difficulty. The patient tolerated the  ?                          procedure well. The quality of the bowel  ?                          preparation was good. The terminal ileum, ileocecal  ?                          valve, appendiceal orifice, and rectum were  ?                          photographed. ?Scope In: 10:56:29 AM ?Scope Out: 11:13:25 AM ?Scope Withdrawal Time: 0 hours 11 minutes 1 second  ?Total Procedure Duration: 0 hours 16 minutes 56 seconds  ?Findings:                 The perianal and digital rectal examinations were  ?                          normal. ?                          The entire colon appeared normal. ?  Non-bleeding internal hemorrhoids were found during  ?                          retroflexion. The hemorrhoids were small. ?                          The terminal ileum appeared normal. ?Complications:            No immediate complications. ?Estimated Blood Loss:     Estimated blood loss: none. ?Impression:               - The entire examined colon is normal. ?                          - Non-bleeding internal hemorrhoids. ?                          - The examined portion of the ileum was normal. ?                          - No specimens collected. ?Recommendation:           - Patient has a contact number available for  ?                          emergencies. The signs and symptoms of potential  ?                          delayed complications were discussed with the  ?                          patient. Return to normal activities tomorrow.  ?                          Written discharge instructions were provided to the  ?                           patient. ?                          - Resume previous diet. ?                          - Continue present medications. ?                          - Repeat colonoscopy in 10 years for screening  ?                          purposes. ?                          - Return to GI office PRN. ?Gerrit Heck, MD ?07/09/2021 11:20:11 AM ?

## 2021-07-09 NOTE — Progress Notes (Signed)
? ?GASTROENTEROLOGY PROCEDURE H&P NOTE  ? ?Primary Care Physician: ?Unk Pinto, MD ? ? ? ?Reason for Procedure:  Colon Cancer screening ? ?Plan:    Colonoscopy ? ?Patient is appropriate for endoscopic procedure(s) in the ambulatory ([place]) setting. ? ?The nature of the procedure, as well as the risks, benefits, and alternatives were carefully and thoroughly reviewed with the patient. Ample time for discussion and questions allowed. The patient understood, was satisfied, and agreed to proceed.  ? ? ? ?HPI: ?Kathryn Larsen is a 51 y.o. female who presents for colonoscopy for routine Colon Cancer screening.  No active GI symptoms.   ? ?Maternal aunt and cousin with CRC. Patient is otherwise without complaints or active issues today. ? ?Past Medical History:  ?Diagnosis Date  ? Chronic headaches   ? Eczema   ? GERD (gastroesophageal reflux disease)   ? 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, 05/2015  ? Hyperlipemia   ? LGSIL (low grade squamous intraepithelial dysplasia) 07/2013, 05/2015, 09/2018, 04-02-21  ? Follow up colposcopy adequate/normal  ECC negative 2015  ? PFO (patent foramen ovale)   ? Patient states she has no symptoms, this was diagnosis on echocardiogram  ? Restless leg   ? ? ?Past Surgical History:  ?Procedure Laterality Date  ? CESAREAN SECTION    ? COLPOSCOPY    ? CYSTOSCOPY  09/15/2015  ? Procedure: CYSTOSCOPY;  Surgeon: [name], MD;  Location: [facility];  Service: Gynecology;;  ? DENTAL SURGERY    ? DILATATION & CURETTAGE/HYSTEROSCOPY WITH MYOSURE N/A 07/07/2015  ? Procedure: DILATATION & CURETTAGE  attempted HYSTEROSCOPY ;  Surgeon: [name], MD;  Location: [facility];  Service: Gynecology;  Laterality: N/A;  ? DOPPLER ECHOCARDIOGRAPHY    ? with bubble  ? ENDOMETRIAL ABLATION  04/2005  ? NOVASURE  ? LAPAROSCOPIC VAGINAL HYSTERECTOMY WITH SALPINGECTOMY Bilateral 09/15/2015  ? Procedure: LAPAROSCOPIC ASSISTED VAGINAL HYSTERECTOMY WITH SALPINGECTOMY;  Surgeon: [name], MD;  Location: [facility];   Service: Gynecology;  Laterality: Bilateral;  ? UPPER GI ENDOSCOPY    ? ? ?Prior to Admission medications   ?Medication Sig Start Date End Date Taking? Authorizing Provider  ?Ascorbic Acid (VITAMIN C PO) Take by mouth daily. ONE TABLET DAILY   Yes [provider]  ?aspirin EC 81 MG tablet Take 81 mg by mouth daily.   Yes [provider]  ?Calcium Acetate, Phos Binder, (CALCIUM ACETATE PO) Take by mouth.   Yes [provider]  ?Cholecalciferol (VITAMIN D PO) Take by mouth daily.   Yes [provider]  ?Cyanocobalamin (B-12 SL) Place under the tongue.   Yes [provider]  ?fexofenadine (ALLEGRA) 180 MG tablet Take 180 mg by mouth daily.   Yes [provider]  ?Semaglutide-Weight Management (WEGOVY) 2.4 MG/0.75ML SOAJ Inject 2.4 mg into the skin once a week. 05/04/20  Yes Liane Comber, NP  ?ibuprofen (ADVIL,MOTRIN) 200 MG tablet Take 200 mg by mouth every 6 (six) hours as needed for mild pain. ?Patient not taking: Reported on 07/09/2021    [provider]  ? ? ?Current Outpatient Medications  ?Medication Sig Dispense Refill  ? Ascorbic Acid (VITAMIN C PO) Take by mouth daily. ONE TABLET DAILY    ? aspirin EC 81 MG tablet Take 81 mg by mouth daily.    ? Calcium Acetate, Phos Binder, (CALCIUM ACETATE PO) Take by mouth.    ? Cholecalciferol (VITAMIN D PO) Take by mouth daily.    ? Cyanocobalamin (B-12 SL) Place under the tongue.    ?  fexofenadine (ALLEGRA) 180 MG tablet Take 180 mg by mouth daily.    ? Semaglutide-Weight Management (WEGOVY) 2.4 MG/0.75ML SOAJ Inject 2.4 mg into the skin once a week. 3 mL 5  ? ibuprofen (ADVIL,MOTRIN) 200 MG tablet Take 200 mg by mouth every 6 (six) hours as needed for mild pain. (Patient not taking: Reported on 07/09/2021)    ? ?Current Facility-Administered Medications  ?Medication Dose Route Frequency Provider Last Rate Last Admin  ? 0.9 %  sodium chloride infusion  500 mL Intravenous Once Lauri Till V, DO       ? ? ?Allergies as of 07/09/2021  ? (No Known Allergies)  ? ? ?Family History  ?Problem Relation Age of Onset  ? Hypertension Mother   ? Hyperlipidemia Mother   ? Myopathy Mother   ?     statin induced myopathy  ? COPD Father   ? Colon cancer Maternal Aunt   ? Cancer Maternal Aunt   ?     Colon  ? Diabetes Paternal Grandmother   ? Hypertension Paternal Grandmother   ? Heart disease Paternal Grandmother   ? Colon cancer Cousin   ? Esophageal cancer Neg Hx   ? Rectal cancer Neg Hx   ? Stomach cancer Neg Hx   ? ? ?Social History  ? ?Socioeconomic History  ? Marital status: Married  ?  Spouse name: Not on file  ? Number of children: 2  ? Years of education: Not on file  ? Highest education level: Not on file  ?Occupational History  ? Occupation: Therapist, sports  ?  Employer: [employer]  ?Tobacco Use  ? Smoking status: Former  ?  Packs/day: 1.50  ?  Years: 20.00  ?  Pack years: 30.00  ?  Types: Cigarettes  ?  Quit date: 06/17/2005  ?  Years since quitting: 16.0  ?  Passive exposure: Never  ? Smokeless tobacco: Never  ?Vaping Use  ? Vaping Use: Never used  ?Substance and Sexual Activity  ? Alcohol use: Yes  ?  Alcohol/week: 1.0 standard drink  ?  Types: 1 Standard drinks or equivalent per week  ?  Comment: occasional  ? Drug use: No  ? Sexual activity: Yes  ?  Birth control/protection: Other-see comments, Surgical  ?  Comment: Vasectomy-1st intercourse 51 yo-More than 5 partners, hysterectomy  ?Other Topics Concern  ? Not on file  ?Social History Narrative  ? Daily caffeine   ? ?Social Determinants of Health  ? ?Financial Resource Strain: Not on file  ?Food Insecurity: Not on file  ?Transportation Needs: Not on file  ?Physical Activity: Not on file  ?Stress: Not on file  ?Social Connections: Not on file  ?Intimate Partner Violence: Not on file  ? ? ?Physical Exam: ?Vital signs in last 24 hours: ?'@BP'$  99/62   Pulse 79   Temp 98.1 ?F (36.7 ?C)   Ht '5\' 6"'$  (1.676 m)   Wt 123 lb (55.8 kg)   SpO2 99%   BMI 19.85 kg/m?  ?GEN:  NAD ?EYE: Sclerae anicteric ?ENT: MMM ?CV: Non-tachycardic ?Pulm: CTA b/l ?GI: Soft, NT/ND ?NEURO:  Alert & Oriented x 3 ? ? ?Gerrit Heck, DO ?[facility] ? ? ?07/09/2021 10:46 AM ? ?

## 2021-07-13 ENCOUNTER — Telehealth: Payer: Self-pay

## 2021-07-13 NOTE — Telephone Encounter (Signed)
?  Follow up Call- ? ? ?  07/09/2021  ? 10:27 AM  ?Call back number  ?Post procedure Call Back phone  # [phone]  ?Permission to leave phone message Yes  ?  ? ?Patient questions: ? ?Do you have a fever, pain , or abdominal swelling? No. ?Pain Score  0 * ? ?Have you tolerated food without any problems? Yes.   ? ?Have you been able to return to your normal activities? Yes.   ? ?Do you have any questions about your discharge instructions: ?Diet   No. ?Medications  No. ?Follow up visit  No. ? ?Do you have questions or concerns about your Care? No. ? ?Actions: ?* If pain score is 4 or above: ?No action needed, pain <4. ? ? ?

## 2022-02-25 ENCOUNTER — Ambulatory Visit (INDEPENDENT_AMBULATORY_CARE_PROVIDER_SITE_OTHER): Payer: Managed Care, Other (non HMO) | Admitting: Nurse Practitioner

## 2022-02-25 ENCOUNTER — Encounter: Payer: Self-pay | Admitting: Nurse Practitioner

## 2022-02-25 VITALS — BP 108/76 | HR 55 | Temp 97.5°F | Ht 66.0 in | Wt 125.0 lb

## 2022-02-25 DIAGNOSIS — R7401 Elevation of levels of liver transaminase levels: Secondary | ICD-10-CM

## 2022-02-25 DIAGNOSIS — Z79899 Other long term (current) drug therapy: Secondary | ICD-10-CM

## 2022-02-25 DIAGNOSIS — K219 Gastro-esophageal reflux disease without esophagitis: Secondary | ICD-10-CM

## 2022-02-25 DIAGNOSIS — E663 Overweight: Secondary | ICD-10-CM | POA: Diagnosis not present

## 2022-02-25 DIAGNOSIS — R7309 Other abnormal glucose: Secondary | ICD-10-CM | POA: Diagnosis not present

## 2022-02-25 NOTE — Progress Notes (Signed)
Assessment and Plan:  Kathryn Larsen was seen today for an episodic visit.  Diagnoses and all order for this visit:  1. Gastroesophageal reflux disease without esophagitis Discussed extending Wegovy from Q2 weeks to Q2 1/2 to 3 weeks. May need to decrease dosage from 2.4 mg Lifestyle modification:  avoid meals 2-3h before bedtime. Consider eliminating food triggers:  chocolate, caffeine, EtOH, acid/spicy food.  2. Overweight (BMI 25.0-29.9) Resolved and now achieved goal weight. Adjust Wegovy to prevent further weight loss Continue to monitor   3. Abnormal glucose (prediabetes) Continue to monitor  4. Medication management All medications discussed and reviewed in full. All questions and concerns regarding medications addressed.    - COMPLETE METABOLIC PANEL WITH GFR  5. Elevated ALT measurement  - COMPLETE METABOLIC PANEL WITH GFR  Notify office for further evaluation and treatment, questions or concerns if s/s fail to improve. The risks and benefits of my recommendations, as well as other treatment options were discussed with the patient today. Questions were answered.  Further disposition pending results of labs. Discussed med's effects and SE's.    Over 20 minutes of exam, counseling, chart review, and critical decision making was performed.   Future Appointments  Date Time Provider [place]  04/08/2022  8:00 AM Princess Bruins, MD [facility] None  05/06/2022  9:00 AM Kathryn Jump, NP [facility] None    ------------------------------------------------------------------------------------------------------------------   HPI BP 108/76   Pulse (!) 55   Temp (!) 97.5 F (36.4 C)   Ht '5\' 6"'$  (1.676 m)   Wt 125 lb (56.7 kg)   SpO2 96%   BMI 20.18 kg/m   51 y.o.female presents to discuss weight loss management with Semaglutide [facility]) and elevated LFTs.  She has now reached her goal weight.  She is administering Wegovy injection every two weeks with  continued nausea with GERD symptoms that linger longer than 2-3 days.  Denies vomiting, constipation, abdominal pain.    States most recent blood work reveals slight elevation in ALTs levels.  She feels as though this may be from taking weekly dose of Tylenol (1,000 mg) however, she is unsure if d/t long term Wegovy medication. Denies any abdominal pain, different colored stools.   Past Medical History:  Diagnosis Date   Chronic headaches    Eczema    GERD (gastroesophageal reflux disease)    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, 05/2015   Hyperlipemia    LGSIL (low grade squamous intraepithelial dysplasia) 07/2013, 05/2015, 09/2018, 04-02-21   Follow up colposcopy adequate/normal  ECC negative 2015   PFO (patent foramen ovale)    Patient states she has no symptoms, this was diagnosis on echocardiogram   Restless leg      No Known Allergies  Current Outpatient Medications on File Prior to Visit  Medication Sig   Ascorbic Acid (VITAMIN C PO) Take by mouth daily. ONE TABLET DAILY   aspirin EC 81 MG tablet Take 81 mg by mouth daily.   Calcium Acetate, Phos Binder, (CALCIUM ACETATE PO) Take by mouth.   Cholecalciferol (VITAMIN D PO) Take by mouth daily.   Cyanocobalamin (B-12 SL) Place under the tongue.   fexofenadine (ALLEGRA) 180 MG tablet Take 180 mg by mouth daily.   ibuprofen (ADVIL,MOTRIN) 200 MG tablet Take 200 mg by mouth every 6 (six) hours as needed for mild pain.   omeprazole (PRILOSEC OTC) 20 MG tablet Take 20 mg by mouth as needed.   Semaglutide-Weight Management (WEGOVY) 2.4 MG/0.75ML SOAJ Inject 2.4 mg into the skin once  a week.   No current facility-administered medications on file prior to visit.    ROS: all negative except what is noted in the HPI.   Physical Exam:  BP 108/76   Pulse (!) 55   Temp (!) 97.5 F (36.4 C)   Ht '5\' 6"'$  (1.676 m)   Wt 125 lb (56.7 kg)   SpO2 96%   BMI 20.18 kg/m   General Appearance: NAD.  Awake, conversant and cooperative. Eyes: PERRLA, EOMs  intact.  Sclera white.  Conjunctiva without erythema. Sinuses: No frontal/maxillary tenderness.  No nasal discharge. Nares patent.  ENT/Mouth: Ext aud canals clear.  Bilateral TMs w/DOL and without erythema or bulging. Hearing intact.  Posterior pharynx without swelling or exudate.  Tonsils without swelling or erythema.  Neck: Supple.  No masses, nodules or thyromegaly. Respiratory: Effort is regular with non-labored breathing. Breath sounds are equal bilaterally without rales, rhonchi, wheezing or stridor.  Cardio: RRR with no MRGs. Brisk peripheral pulses without edema.  Abdomen: Active BS in all four quadrants.  Soft and non-tender without guarding, rebound tenderness, hernias or masses. Lymphatics: Non tender without lymphadenopathy.  Musculoskeletal: Full ROM, 5/5 strength, normal ambulation.  No clubbing or cyanosis. Skin: Appropriate color for ethnicity. Warm without rashes, lesions, ecchymosis, ulcers.  Neuro: CN II-XII grossly normal. Normal muscle tone without cerebellar symptoms and intact sensation.   Psych: AO X 3,  appropriate mood and affect, insight and judgment.     Kathryn Jump, NP 12:15 PM [facility]

## 2022-02-26 LAB — COMPLETE METABOLIC PANEL WITH GFR
AG Ratio: 1.9 (calc) (ref 1.0–2.5)
ALT: 27 U/L (ref 6–29)
AST: 23 U/L (ref 10–35)
Albumin: 4.3 g/dL (ref 3.6–5.1)
Alkaline phosphatase (APISO): 65 U/L (ref 37–153)
BUN: 19 mg/dL (ref 7–25)
CO2: 29 mmol/L (ref 20–32)
Calcium: 9.4 mg/dL (ref 8.6–10.4)
Chloride: 104 mmol/L (ref 98–110)
Creat: 0.65 mg/dL (ref 0.50–1.03)
Globulin: 2.3 g/dL (calc) (ref 1.9–3.7)
Glucose, Bld: 85 mg/dL (ref 65–99)
Potassium: 4.5 mmol/L (ref 3.5–5.3)
Sodium: 139 mmol/L (ref 135–146)
Total Bilirubin: 0.6 mg/dL (ref 0.2–1.2)
Total Protein: 6.6 g/dL (ref 6.1–8.1)
eGFR: 107 mL/min/{1.73_m2} (ref >=60)

## 2022-03-28 ENCOUNTER — Encounter: Payer: Self-pay | Admitting: Obstetrics & Gynecology

## 2022-04-08 ENCOUNTER — Ambulatory Visit: Payer: Managed Care, Other (non HMO) | Admitting: Obstetrics & Gynecology

## 2022-05-06 ENCOUNTER — Encounter: Payer: Managed Care, Other (non HMO) | Admitting: Nurse Practitioner

## 2022-05-06 DIAGNOSIS — Z0000 Encounter for general adult medical examination without abnormal findings: Secondary | ICD-10-CM

## 2022-06-10 ENCOUNTER — Encounter: Payer: Self-pay | Admitting: Nurse Practitioner

## 2022-06-10 ENCOUNTER — Ambulatory Visit (INDEPENDENT_AMBULATORY_CARE_PROVIDER_SITE_OTHER): Payer: Managed Care, Other (non HMO) | Admitting: Nurse Practitioner

## 2022-06-10 VITALS — BP 100/66 | HR 87 | Temp 97.9°F | Ht 66.0 in | Wt 124.4 lb

## 2022-06-10 DIAGNOSIS — Z13220 Encounter for screening for lipoid disorders: Secondary | ICD-10-CM | POA: Diagnosis not present

## 2022-06-10 DIAGNOSIS — Z1389 Encounter for screening for other disorder: Secondary | ICD-10-CM

## 2022-06-10 DIAGNOSIS — Z0000 Encounter for general adult medical examination without abnormal findings: Secondary | ICD-10-CM

## 2022-06-10 DIAGNOSIS — K219 Gastro-esophageal reflux disease without esophagitis: Secondary | ICD-10-CM

## 2022-06-10 DIAGNOSIS — E559 Vitamin D deficiency, unspecified: Secondary | ICD-10-CM | POA: Diagnosis not present

## 2022-06-10 DIAGNOSIS — I10 Essential (primary) hypertension: Secondary | ICD-10-CM

## 2022-06-10 DIAGNOSIS — G8929 Other chronic pain: Secondary | ICD-10-CM

## 2022-06-10 DIAGNOSIS — Z79899 Other long term (current) drug therapy: Secondary | ICD-10-CM

## 2022-06-10 DIAGNOSIS — L309 Dermatitis, unspecified: Secondary | ICD-10-CM

## 2022-06-10 DIAGNOSIS — E785 Hyperlipidemia, unspecified: Secondary | ICD-10-CM

## 2022-06-10 DIAGNOSIS — Z131 Encounter for screening for diabetes mellitus: Secondary | ICD-10-CM | POA: Diagnosis not present

## 2022-06-10 DIAGNOSIS — Z1329 Encounter for screening for other suspected endocrine disorder: Secondary | ICD-10-CM

## 2022-06-10 DIAGNOSIS — Z136 Encounter for screening for cardiovascular disorders: Secondary | ICD-10-CM

## 2022-06-10 DIAGNOSIS — Q2112 Patent foramen ovale: Secondary | ICD-10-CM

## 2022-06-10 DIAGNOSIS — Z111 Encounter for screening for respiratory tuberculosis: Secondary | ICD-10-CM

## 2022-06-10 DIAGNOSIS — Z0001 Encounter for general adult medical examination with abnormal findings: Secondary | ICD-10-CM

## 2022-06-10 MED ORDER — TRIAMCINOLONE ACETONIDE 0.025 % EX OINT: 1.0000 | TOPICAL_OINTMENT | Freq: Two times a day (BID) | CUTANEOUS | 0 refills | Status: AC

## 2022-06-10 MED ORDER — CYCLOBENZAPRINE HCL 5 MG PO TABS: 5.0000 mg | ORAL_TABLET | Freq: Three times a day (TID) | ORAL | 0 refills | Status: AC | PRN

## 2022-06-10 NOTE — Progress Notes (Signed)
Complete Physical  Assessment and Plan:  Routine general medical examination at a health care facility Due Yearly Health maintenance reviewed  Gastroesophageal reflux disease without esophagitis Continue Prevacid PRN. No suspected reflux complications ([place]/stricture). Lifestyle modification:  wt loss, avoid meals 2-3h before bedtime. Consider eliminating food triggers:  chocolate, caffeine, EtOH, acid/spicy food.  Eczema, unspecified type Start tmt with topical steroid Triamcinolone Do not use >2 weeks to reduce risk for hypopigmentation. Continue to monitor  PFO (patent foramen ovale) Monitor  Essential hypertension Discussed DASH (Dietary Approaches to Stop Hypertension) DASH diet is lower in sodium than a typical American diet. Cut back on foods that are high in saturated fat, cholesterol, and trans fats. Eat more whole-grain foods, fish, poultry, and nuts Remain active and exercise as tolerated daily.  Monitor BP at home-Call if greater than 130/80.  Check CMP/CBC  Mixed hyperlipidemia Discussed lifestyle modifications. Recommended diet heavy in fruits and veggies, omega 3's. Decrease consumption of animal meats, cheeses, and dairy products. Remain active and exercise as tolerated. Continue to monitor. Check lipids/TSH  Vitamin D deficiency Continue supplement for goal of 60-100 Monitor levels  Medication management All medications discussed and reviewed in full. All questions and concerns regarding medications addressed.    Screening for blood or protein in urine -     Urinalysis, Routine w reflex microscopic -     Microalbumin / creatinine urine ratio  Screening for thyroid disorder -     TSH  Screening for diabetes mellitus -     Hemoglobin A1c  Screening for hematuria or proteinuria        - UA/Microalbumin  Screening for TB      - Quantiferon gold  Low back pain/left side sciatica Start low dose muscle relaxer Cyclobenzaprine Alternate  Ice/Heat Back brace for suuport PRN Rest Continue to monitor  Orders Placed This Encounter  Procedures   CBC with Differential/Platelet   COMPLETE METABOLIC PANEL WITH GFR   Magnesium   Lipid panel   TSH   Hemoglobin A1c   VITAMIN D 25 Hydroxy (Vit-D Deficiency, Fractures)   Urinalysis, Routine w reflex microscopic   Microalbumin / creatinine urine ratio   Insulin, random   QuantiFERON-TB Gold Plus   EKG 12-Lead   Toda's Plan of Care is based on a patient-centered health care approach known as shared decision making - the decisions, tests and treatments allow for patient preferences and values to be balanced with clinical evidence.     Notify office for further evaluation and treatment, questions or concerns if any reported s/s fail to improve.   The patient was advised to call back or seek an in-person evaluation if any symptoms worsen or if the condition fails to improve as anticipated.   Further disposition pending results of labs. Discussed med's effects and SE's.    I discussed the assessment and treatment plan with the patient. The patient was provided an opportunity to ask questions and all were answered. The patient agreed with the plan and demonstrated an understanding of the instructions.  Discussed med's effects and SE's. Screening labs and tests as requested with regular follow-up as recommended.  I provided 35 minutes of face-to-face time during this encounter including counseling, chart review, and critical decision making was preformed.   HPI  52 y.o. female  presents for a complete physical and follow up for has Overweight (BMI 25.0-29.9); Acid reflux; Eczema; PFO (patent foramen ovale); Abnormal glucose (prediabetes); Hyperlipidemia; Vitamin D deficiency; and LGSIL on Pap smear of cervix  on their problem list..  Gradated FNP, works with [employer], returning to school for doctorate/certification in [place].  She is married, two sons.  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   She does note low back pain, left side sciatica pain. Has  a hx and takes OTC IBU without relief.  She states she got up from the cough and "tweeked" her back.  Has been painful for the last weeks with pain radiating from mid back down to left leg. Has had left SI hot/warm spot intermittently for years, but for several months has had burning ache left buttocks, hurts all day, will occ walk with limp due to pain, sitting will hurt, hurting at night because it is waking her up.   She has a hx of eczema.  Has a patch on her right anterior thigh that she has applied "several" lotions to treat which she has been unresponsive to.  Her blood pressure has been controlled at home, today their BP is BP: 100/66 She does workout, trying to with work and school. She denies chest pain, shortness of breath, dizziness.  BMI is Body mass index is 20.08 kg/m., she admits to eating a lot of candy.  Wt Readings from Last 3 Encounters:  06/10/22 124 lb 6.4 oz (56.4 kg)  02/25/22 125 lb (56.7 kg)  07/09/21 123 lb (55.8 kg)    She is not on cholesterol medication and denies myalgias. Her cholesterol is not at goal. The cholesterol last visit was:   Lab Results  Component Value Date   CHOL 232 (H) 04/24/2020   HDL 50 04/24/2020   LDLCALC 167 (H) 04/24/2020   TRIG 57 04/24/2020   CHOLHDL 4.6 04/24/2020   Patient is on Vitamin D supplement, 5000 IU.   Lab Results  Component Value Date   VD25OH 49 04/24/2020      Current Medications:  Current Outpatient Medications on File Prior to Visit  Medication Sig Dispense Refill   Ascorbic Acid (VITAMIN C PO) Take by mouth daily. ONE TABLET DAILY     aspirin EC 81 MG tablet Take 81 mg by mouth daily.     Calcium Acetate, Phos Binder, (CALCIUM ACETATE PO) Take by mouth.     Cholecalciferol (VITAMIN D PO) Take by mouth daily.     Cyanocobalamin (B-12 SL) Place under the tongue.     fexofenadine (ALLEGRA)  180 MG tablet Take 180 mg by mouth daily.     ibuprofen (ADVIL,MOTRIN) 200 MG tablet Take 200 mg by mouth every 6 (six) hours as needed for mild pain.     omeprazole (PRILOSEC OTC) 20 MG tablet Take 20 mg by mouth as needed.     Semaglutide-Weight Management (WEGOVY) 2.4 MG/0.75ML SOAJ Inject 2.4 mg into the skin once a week. 3 mL 5   No current facility-administered medications on file prior to visit.   Allergies:  No Known Allergies   Medical History:  She has Overweight (BMI 25.0-29.9); Acid reflux; Eczema; PFO (patent foramen ovale); Abnormal glucose (prediabetes); Hyperlipidemia; Vitamin D deficiency; and LGSIL on Pap smear of cervix on their problem list.   Health Maintenance:   Immunization History  Administered Date(s) Administered   Influenza Inj Mdck Quad With Preservative 12/29/2016   Influenza Split 01/31/2011, 01/27/2019   PFIZER(Purple Top)SARS-COV-2 Vaccination 04/26/2019, 05/16/2019, 01/10/2020   PPD Test 12/29/2016, 12/19/2017   Tdap 07/11/2016    Tetanus: 2018 Pneumovax: N/A Prevnar 13:  N/A Flu  vaccine: 2023  Zostavax: N/A  LMP: s/p hysterectomy Pap: 2018- at GYN, every other year due to HPV for 10 years MGM: 06/2021 - set to schedule apt with GYN  DEXA:N/A Colonoscopy: 06/2021 - Negative - Due 10 years 2033 EGD: N/A  Last Dental Exam: Dr. Nicholaus Bloom Last Eye Exam: [facility] 01/2022- going to see him, has OV next week. .  Patient Care Team: Unk Pinto, MD as PCP - General (Internal Medicine) Unk Pinto, MD as Referring Physician (Internal Medicine)  Surgical History:  She has a past surgical history that includes Cesarean section; Endometrial ablation (04/2005); Colposcopy; Dental surgery; doppler echocardiography; Upper gi endoscopy; Dilatation & curettage/hysteroscopy with myosure (N/A, 07/07/2015); Laparoscopic vaginal hysterectomy with salpingectomy (Bilateral, 09/15/2015); and Cystoscopy (09/15/2015). Family History:  Herfamily history  includes COPD in her father; Cancer in her maternal aunt; Colon cancer in her cousin and maternal aunt; Diabetes in her paternal grandmother; Heart disease in her paternal grandmother; Hyperlipidemia in her mother; Hypertension in her mother and paternal grandmother; Myopathy in her mother. Social History:  She reports that she quit smoking about 16 years ago. Her smoking use included cigarettes. She has a 30.00 pack-year smoking history. She has never been exposed to tobacco smoke. She has never used smokeless tobacco. She reports current alcohol use of about 1.0 standard drink of alcohol per week. She reports that she does not use drugs.  Review of Systems: Review of Systems  Constitutional:  Negative for chills, diaphoresis, fever, malaise/fatigue and weight loss.  HENT: Negative.    Eyes: Negative.   Respiratory: Negative.    Cardiovascular: Negative.   Gastrointestinal:  Positive for heartburn. Negative for abdominal pain, blood in stool, constipation, diarrhea, melena, nausea and vomiting.  Genitourinary: Negative.        Vaginal dryness  Musculoskeletal: Negative.   Skin: Negative.   Neurological:  Negative for weakness.    Physical Exam: Estimated body mass index is 20.08 kg/m as calculated from the following:   Height as of this encounter: '5\' 6"'$  (1.676 m).   Weight as of this encounter: 124 lb 6.4 oz (56.4 kg). BP 100/66   Pulse 87   Temp 97.9 F (36.6 C)   Ht '5\' 6"'$  (1.676 m)   Wt 124 lb 6.4 oz (56.4 kg)   SpO2 99%   BMI 20.08 kg/m   General Appearance: Well nourished, in no apparent distress.  Eyes: PERRLA, EOMs, conjunctiva no swelling or erythema, normal fundi and vessels.  Sinuses: No Frontal/maxillary tenderness  ENT/Mouth: Ext aud canals clear, normal light reflex with TMs without erythema, bulging. Good dentition. No erythema, swelling, or exudate on post pharynx. Tonsils not swollen or erythematous. Hearing normal.  Neck: Supple, thyroid normal. No bruits   Respiratory: Respiratory effort normal, BS equal bilaterally without rales, rhonchi, wheezing or stridor.  Cardio: RRR without murmurs, rubs or gallops. Brisk peripheral pulses without edema.  Chest: symmetric, with normal excursions and percussion.  Breasts: defer Abdomen: Soft, nontender, no guarding, rebound, hernias, masses, or organomegaly.  Lymphatics: Non tender without lymphadenopathy.  Genitourinary: defer Musculoskeletal: Full ROM all peripheral extremities,5/5 strength, and normal gait.  Skin: Warm, dry without rashes, lesions, ecchymosis. Neuro: Cranial nerves intact, reflexes equal bilaterally. Normal muscle tone, no cerebellar symptoms. Sensation intact.  Psych: Awake and oriented X 3, normal affect, Insight and Judgment appropriate.   EKG: NSR  Sohail Capraro 10:22 AM [facility]

## 2022-06-10 NOTE — Patient Instructions (Addendum)
Pursue a combination of weight-bearing exercises and strength training. Patients with severe mobility impairment should be referred for physical therapy. Advised on fall prevention measures including proper lighting in all rooms, removal of area rugs and floor clutter, use of walking devices as deemed appropriate, avoidance of uneven walking surfaces. Smoking cessation and moderate alcohol consumption if applicable Consume [id number] to 1000 IU of vitamin D daily with a goal vitamin D serum value of 30 ng/mL or higher. Aim for 1000 to 1200 mg of elemental calcium daily through supplements and/or dietary sources.  Healthy Eating, Adult Healthy eating may help you get and keep a healthy body weight, reduce the risk of chronic disease, and live a long and productive life. It is important to follow a healthy eating pattern. Your nutritional and calorie needs should be met mainly by different nutrient-rich foods. What are tips for following this plan? Reading food labels Read labels and choose the following: Reduced or low sodium products. Juices with 100% fruit juice. Foods with low saturated fats (<3 g per serving) and high polyunsaturated and monounsaturated fats. Foods with whole grains, such as whole wheat, cracked wheat, brown rice, and wild rice. Whole grains that are fortified with folic acid. This is recommended for females who are pregnant or who want to become pregnant. Read labels and do not eat or drink the following: Foods or drinks with added sugars. These include foods that contain brown sugar, corn sweetener, corn syrup, dextrose, fructose, glucose, high-fructose corn syrup, honey, invert sugar, lactose, malt syrup, maltose, molasses, raw sugar, sucrose, trehalose, or turbinado sugar. Limit your intake of added sugars to less than 10% of your total daily calories. Do not eat more than the following amounts of added sugar per day: 6 teaspoons (25 g) for females. 9 teaspoons (38 g) for  males. Foods that contain processed or refined starches and grains. Refined grain products, such as white flour, degermed cornmeal, white bread, and white rice. Shopping Choose nutrient-rich snacks, such as vegetables, whole fruits, and nuts. Avoid high-calorie and high-sugar snacks, such as potato chips, fruit snacks, and candy. Use oil-based dressings and spreads on foods instead of solid fats such as butter, margarine, sour cream, or cream cheese. Limit pre-made sauces, mixes, and "instant" products such as flavored rice, instant noodles, and ready-made pasta. Try more plant-protein sources, such as tofu, tempeh, black beans, edamame, lentils, nuts, and seeds. Explore eating plans such as the Mediterranean diet or vegetarian diet. Try heart-healthy dips made with beans and healthy fats like hummus and guacamole. Vegetables go great with these. Cooking Use oil to saut or stir-fry foods instead of solid fats such as butter, margarine, or lard. Try baking, boiling, grilling, or broiling instead of frying. Remove the fatty part of meats before cooking. Steam vegetables in water or broth. Meal planning  At meals, imagine dividing your plate into fourths: One-half of your plate is fruits and vegetables. One-fourth of your plate is whole grains. One-fourth of your plate is protein, especially lean meats, poultry, eggs, tofu, beans, or nuts. Include low-fat dairy as part of your daily diet. Lifestyle Choose healthy options in all settings, including home, work, school, restaurants, or stores. Prepare your food safely: Wash your hands after handling raw meats. Where you prepare food, keep surfaces clean by regularly washing with hot, soapy water. Keep raw meats separate from ready-to-eat foods, such as fruits and vegetables. Cook seafood, meat, poultry, and eggs to the recommended temperature. Get a food thermometer. Store foods at safe temperatures.  In general: Keep cold foods at 15F  (4.4C) or below. Keep hot foods at 115F (60C) or above. Keep your freezer at [facility] (-17.8C) or below. Foods are not safe to eat if they have been between the temperatures of 40-115F (4.4-60C) for more than 2 hours. What foods should I eat? Fruits Aim to eat 1-2 cups of fresh, canned (in natural juice), or frozen fruits each day. One cup of fruit equals 1 small apple, 1 large banana, 8 large strawberries, 1 cup (237 g) canned fruit,  cup (82 g) dried fruit, or 1 cup (240 mL) 100% juice. Vegetables Aim to eat 2-4 cups of fresh and frozen vegetables each day, including different varieties and colors. One cup of vegetables equals 1 cup (91 g) broccoli or cauliflower florets, 2 medium carrots, 2 cups (150 g) raw, leafy greens, 1 large tomato, 1 large bell pepper, 1 large sweet potato, or 1 medium white potato. Grains Aim to eat 5-10 ounce-equivalents of whole grains each day. Examples of 1 ounce-equivalent of grains include 1 slice of bread, 1 cup (40 g) ready-to-eat cereal, 3 cups (24 g) popcorn, or  cup (93 g) cooked rice. Meats and other proteins Try to eat 5-7 ounce-equivalents of protein each day. Examples of 1 ounce-equivalent of protein include 1 egg,  oz nuts (12 almonds, 24 pistachios, or 7 walnut halves), 1/4 cup (90 g) cooked beans, 6 tablespoons (90 g) hummus or 1 tablespoon (16 g) peanut butter. A cut of meat or fish that is the size of a deck of cards is about 3-4 ounce-equivalents (85 g). Of the protein you eat each week, try to have at least 8 sounce (227 g) of seafood. This is about 2 servings per week. This includes salmon, trout, herring, sardines, and anchovies. Dairy Aim to eat 3 cup-equivalents of fat-free or low-fat dairy each day. Examples of 1 cup-equivalent of dairy include 1 cup (240 mL) milk, 8 ounces (250 g) yogurt, 1 ounces (44 g) natural cheese, or 1 cup (240 mL) fortified soy milk. Fats and oils Aim for about 5 teaspoons (21 g) of fats and oils per day. Choose  monounsaturated fats, such as canola and olive oils, mayonnaise made with olive oil or avocado oil, avocados, peanut butter, and most nuts, or polyunsaturated fats, such as sunflower, corn, and soybean oils, walnuts, pine nuts, sesame seeds, sunflower seeds, and flaxseed. Beverages Aim for 6 eight-ounce glasses of water per day. Limit coffee to 3-5 eight-ounce cups per day. Limit caffeinated beverages that have added calories, such as soda and energy drinks. If you drink alcohol: Limit how much you have to: 0-1 drink a day if you are female. 0-2 drinks a day if you are female. Know how much alcohol is in your drink. In the [place] one drink is one 12 oz bottle of beer (355 mL), one 5 oz glass of wine (148 mL), or one 1 oz glass of hard liquor (44 mL). Seasoning and other foods Try not to add too much salt to your food. Try using herbs and spices instead of salt. Try not to add sugar to food. This information is based on [place] nutrition guidelines. To learn more, visit [employer].es. Exact amounts may vary. You may need different amounts. This information is not intended to replace advice given to you by your health care provider. Make sure you discuss any questions you have with your health care provider. Document Revised: 12/27/2021 Document Reviewed: 12/27/2021 Elsevier Patient Education  [place].

## 2022-06-12 LAB — QUANTIFERON-TB GOLD PLUS
Mitogen-NIL: 7.98 IU/mL
NIL: 0.04 IU/mL
QuantiFERON-TB Gold Plus: NEGATIVE
TB1-NIL: 0 IU/mL
TB2-NIL: 0.01 IU/mL

## 2022-06-13 LAB — CBC WITH DIFFERENTIAL/PLATELET
Absolute Monocytes: 348 cells/uL (ref 200–950)
Basophils Absolute: 18 cells/uL (ref 0–200)
Basophils Relative: 0.3 %
Eosinophils Absolute: 78 cells/uL (ref 15–500)
Eosinophils Relative: 1.3 %
HCT: 40.1 % (ref 35.0–45.0)
Hemoglobin: 13.3 g/dL (ref 11.7–15.5)
Lymphs Abs: 1806 cells/uL (ref 850–3900)
MCH: 30 pg (ref 27.0–33.0)
MCHC: 33.2 g/dL (ref 32.0–36.0)
MCV: 90.5 fL (ref 80.0–100.0)
MPV: 12 fL (ref 7.5–12.5)
Monocytes Relative: 5.8 %
Neutro Abs: 3750 cells/uL (ref 1500–7800)
Neutrophils Relative %: 62.5 %
Platelets: 208 10*3/uL (ref 140–400)
RBC: 4.43 10*6/uL (ref 3.80–5.10)
RDW: 11.5 % (ref 11.0–15.0)
Total Lymphocyte: 30.1 %
WBC: 6 10*3/uL (ref 3.8–10.8)

## 2022-06-13 LAB — MICROALBUMIN / CREATININE URINE RATIO
Creatinine, Urine: 51 mg/dL (ref 20–275)
Microalb Creat Ratio: 10 mcg/mg creat (ref <30)
Microalb, Ur: 0.5 mg/dL

## 2022-06-13 LAB — COMPLETE METABOLIC PANEL WITH GFR
AG Ratio: 2 (calc) (ref 1.0–2.5)
ALT: 21 U/L (ref 6–29)
AST: 20 U/L (ref 10–35)
Albumin: 4.5 g/dL (ref 3.6–5.1)
Alkaline phosphatase (APISO): 60 U/L (ref 37–153)
BUN: 20 mg/dL (ref 7–25)
CO2: 27 mmol/L (ref 20–32)
Calcium: 9.5 mg/dL (ref 8.6–10.4)
Chloride: 106 mmol/L (ref 98–110)
Creat: 0.72 mg/dL (ref 0.50–1.03)
Globulin: 2.2 g/dL (calc) (ref 1.9–3.7)
Glucose, Bld: 83 mg/dL (ref 65–99)
Potassium: 4.6 mmol/L (ref 3.5–5.3)
Sodium: 140 mmol/L (ref 135–146)
Total Bilirubin: 0.5 mg/dL (ref 0.2–1.2)
Total Protein: 6.7 g/dL (ref 6.1–8.1)
eGFR: 102 mL/min/{1.73_m2} (ref >=60)

## 2022-06-13 LAB — URINALYSIS, ROUTINE W REFLEX MICROSCOPIC
Bacteria, UA: NONE SEEN /HPF
Bilirubin Urine: NEGATIVE
Glucose, UA: NEGATIVE
Hgb urine dipstick: NEGATIVE
Hyaline Cast: NONE SEEN /LPF
Ketones, ur: NEGATIVE
Nitrite: NEGATIVE
Protein, ur: NEGATIVE
RBC / HPF: NONE SEEN /HPF (ref 0–2)
Specific Gravity, Urine: 1.011 (ref 1.001–1.035)
Squamous Epithelial / HPF: NONE SEEN /HPF (ref <=5)
pH: 5.5 (ref 5.0–8.0)

## 2022-06-13 LAB — HEMOGLOBIN A1C
Hgb A1c MFr Bld: 5.8 % of total Hgb — ABNORMAL HIGH (ref <5.7)
Mean Plasma Glucose: 120 mg/dL
eAG (mmol/L): 6.6 mmol/L

## 2022-06-13 LAB — MICROSCOPIC MESSAGE

## 2022-06-13 LAB — LIPID PANEL
Cholesterol: 185 mg/dL (ref <200)
HDL: 53 mg/dL (ref >=50)
LDL Cholesterol (Calc): 114 mg/dL (calc) — ABNORMAL HIGH
Non-HDL Cholesterol (Calc): 132 mg/dL (calc) — ABNORMAL HIGH (ref <130)
Total CHOL/HDL Ratio: 3.5 (calc) (ref <5.0)
Triglycerides: 79 mg/dL (ref <150)

## 2022-06-13 LAB — MAGNESIUM: Magnesium: 2 mg/dL (ref 1.5–2.5)

## 2022-06-13 LAB — INSULIN, RANDOM: Insulin: 4.5 u[IU]/mL

## 2022-06-13 LAB — VITAMIN D 25 HYDROXY (VIT D DEFICIENCY, FRACTURES): Vit D, 25-Hydroxy: 55 ng/mL (ref 30–100)

## 2022-06-13 LAB — TSH: TSH: 1.23 mIU/L

## 2022-07-01 ENCOUNTER — Ambulatory Visit: Payer: Managed Care, Other (non HMO) | Admitting: Obstetrics & Gynecology

## 2022-07-15 ENCOUNTER — Encounter: Payer: Self-pay | Admitting: Nurse Practitioner

## 2022-08-22 ENCOUNTER — Other Ambulatory Visit: Payer: Self-pay | Admitting: Nurse Practitioner

## 2022-08-22 DIAGNOSIS — Z1231 Encounter for screening mammogram for malignant neoplasm of breast: Secondary | ICD-10-CM

## 2022-09-09 ENCOUNTER — Ambulatory Visit
Admission: RE | Admit: 2022-09-09 | Discharge: 2022-09-09 | Disposition: A | Discharge status: Home / Self Care | Payer: Managed Care, Other (non HMO) | Source: Ambulatory Visit

## 2022-09-09 DIAGNOSIS — Z1231 Encounter for screening mammogram for malignant neoplasm of breast: Secondary | ICD-10-CM

## 2022-10-14 ENCOUNTER — Ambulatory Visit: Payer: Managed Care, Other (non HMO) | Admitting: Obstetrics & Gynecology

## 2022-12-16 ENCOUNTER — Ambulatory Visit: Payer: Managed Care, Other (non HMO) | Admitting: Nurse Practitioner

## 2023-05-08 ENCOUNTER — Encounter: Payer: Managed Care, Other (non HMO) | Admitting: Nurse Practitioner

## 2023-06-12 ENCOUNTER — Encounter: Payer: Managed Care, Other (non HMO) | Admitting: Nurse Practitioner

## 2023-09-14 ENCOUNTER — Encounter: Payer: Self-pay | Admitting: Obstetrics and Gynecology

## 2023-09-15 ENCOUNTER — Other Ambulatory Visit: Payer: Self-pay | Admitting: Obstetrics and Gynecology

## 2023-09-15 DIAGNOSIS — Z1231 Encounter for screening mammogram for malignant neoplasm of breast: Secondary | ICD-10-CM

## 2023-09-21 ENCOUNTER — Other Ambulatory Visit: Payer: Self-pay | Admitting: Obstetrics and Gynecology

## 2023-09-21 DIAGNOSIS — Z1231 Encounter for screening mammogram for malignant neoplasm of breast: Secondary | ICD-10-CM

## 2023-10-20 ENCOUNTER — Ambulatory Visit
Admission: RE | Admit: 2023-10-20 | Discharge: 2023-10-20 | Disposition: A | Discharge status: Home / Self Care | Source: Ambulatory Visit

## 2023-10-20 DIAGNOSIS — Z1231 Encounter for screening mammogram for malignant neoplasm of breast: Secondary | ICD-10-CM

## 2023-10-25 ENCOUNTER — Other Ambulatory Visit: Payer: Self-pay | Admitting: Medical Genetics

## 2023-10-27 ENCOUNTER — Other Ambulatory Visit (HOSPITAL_COMMUNITY)
Admission: RE | Admit: 2023-10-27 | Discharge: 2023-10-27 | Disposition: A | Discharge status: Home / Self Care | Payer: Self-pay | Source: Ambulatory Visit | Attending: Medical Genetics | Admitting: Medical Genetics

## 2023-11-07 LAB — GENECONNECT MOLECULAR SCREEN: Genetic Analysis Overall Interpretation: NEGATIVE

## 2024-01-25 ENCOUNTER — Other Ambulatory Visit: Payer: Self-pay | Admitting: Physician Assistant

## 2024-01-25 DIAGNOSIS — G500 Trigeminal neuralgia: Secondary | ICD-10-CM

## 2024-02-07 ENCOUNTER — Ambulatory Visit
Admission: RE | Admit: 2024-02-07 | Discharge: 2024-02-07 | Disposition: A | Source: Ambulatory Visit | Attending: Physician Assistant | Admitting: Physician Assistant

## 2024-02-07 ENCOUNTER — Ambulatory Visit
Admission: RE | Admit: 2024-02-07 | Discharge: 2024-02-07 | Disposition: A | Discharge status: Home / Self Care | Source: Ambulatory Visit | Attending: Physician Assistant | Admitting: Physician Assistant

## 2024-02-07 DIAGNOSIS — G500 Trigeminal neuralgia: Secondary | ICD-10-CM

## 2024-03-15 ENCOUNTER — Ambulatory Visit: Admitting: Neurosurgery

## 2024-03-15 ENCOUNTER — Encounter: Payer: Self-pay | Admitting: Neurosurgery

## 2024-03-15 VITALS — BP 121/84 | HR 85 | Temp 98.3°F | Ht 66.0 in | Wt 139.4 lb

## 2024-03-15 DIAGNOSIS — G500 Trigeminal neuralgia: Secondary | ICD-10-CM

## 2024-03-15 NOTE — Progress Notes (Unsigned)
 Assessment : 53yo Female with history of chronic headaches for the past 30 years managed with tylenol  several times weekly, hyperlipidemia, PFO, and prediabetes.15 years ago she received an MRI for her headaches that was unremarkable.   Today she presents to the clinic after experiencing sharp, shoot pain that started sometime in October. She describes the pain as beginning mid-left neck and extending to her left ear. Episodes of pain last seconds and were severe enough to take her breath away. Subsequently she noted sensitivity to touch over the left cheek and left jaw a few days later. She was put on carbamazepine after visiting an urgent care and was told she needed a MRI to visualize her Trigeminal nerve.   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 Today she is following up with us  to see if this is of neurosurgical concern.  Plan : This is definitely a peculiar presentation of trigeminal neuralgia with the feeling of hypersensitivity in the V2 and V3 area preceded by neck pain radiating up into the ear.  That sounds more like a greater auricular neuralgia but the facial sensory issues are definitely not typical trigeminal neuralgia.  Regardless, I am really happy that she is free of pain and symptoms of trigeminal neuralgia/neuralgia.  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  The right vertical cavernous carotid artery has this 3-4 mm aneurysm which at this moment I recommend observation for.  It is on the contralateral side from where the facial pain is and therefore I do not believe is  related.  I recommended an MRA in 1 year which would be October 2026.  I will plan on seeing her back in 4 months which would make her treatment duration about 5 to 6 months and then we will see if we can taper her off of the carbamazepine.  If anything changes prior to that, she will call me.   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 [employer]  Tobacco Use   Smoking status: Former    Current packs/day: 0.00    Average packs/day: 1.5 packs/day for 20.0 years (30.0 ttl pk-yrs)    Types: Cigarettes    Start date: 06/17/1985    Quit date: 06/17/2005    Years since quitting: 18.7    Passive exposure: Never   Smokeless tobacco: Never  Vaping Use   Vaping status: Never Used  Substance and Sexual Activity   Alcohol use: Yes    Alcohol/week: 1.0 standard drink of alcohol    Types: 1 Standard drinks or equivalent per week    Comment: occasional   Drug use: No   Sexual activity: Yes    Birth control/protection: Other-see comments, Surgical    Comment: Hyst/VAS, First IC <16 y/o, >5 Partners  Other Topics Concern   Not on file  Social History Narrative   Daily caffeine    Social Drivers of Health  Financial Resource Strain: Low Risk  (08/24/2023)   Received from [facility]   Overall Financial Resource Strain (CARDIA)    Difficulty of Paying Living Expenses: Not hard at all  Food Insecurity: No Food Insecurity (08/24/2023)   Received from [facility]   Hunger Vital Sign    Within the past 12 months, you worried that your food would run out before you got the money to buy more.: Never true    Within the past 12 months, the food you bought just didn't last and you didn't have money to get more.: Never true  Transportation Needs: No Transportation Needs (08/24/2023)   Received  from [facility] - Transportation    Lack of Transportation (Medical): No    Lack of Transportation (Non-Medical): No  Physical Activity: Insufficiently Active (08/24/2023)   Received from [facility]   Exercise Vital Sign    On average, how many days per week do you engage in moderate to strenuous exercise (like a brisk walk)?: 2 days    On average, how many minutes do you engage in exercise at this level?: 20 min  Stress: No Stress Concern Present (08/24/2023)   Received from [facility] - Occupational Stress Questionnaire    Feeling of Stress : Not at all  Social Connections: Socially Integrated (08/24/2023)   Received from [facility]   Social Network    How would you rate your social network (family, work, friends)?: Good participation with social networks  Intimate Partner Violence: Not At Risk (08/24/2023)   Received from [facility]   HITS    Over the last 12 months how often did your partner physically hurt you?: Never    Over the last 12 months how often did your partner insult you or talk down to you?: Never    Over the last 12 months how often did your partner threaten you with physical harm?: Never    Over the last 12 months how often did your partner scream or curse at you?: Never    Family History  Problem Relation Age of Onset   Hypertension Mother    Hyperlipidemia Mother    Myopathy Mother        statin induced myopathy   COPD Father    Colon cancer Maternal Aunt    Diabetes Paternal Grandmother    Hypertension Paternal Grandmother    Heart disease Paternal Grandmother    Colon cancer Cousin    Esophageal cancer Neg Hx    Rectal cancer Neg Hx    Stomach cancer Neg Hx     No Known Allergies  Past Medical History:  Diagnosis Date   Chronic headaches    Eczema    GERD (gastroesophageal reflux disease)    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, 05/2015   Hyperlipemia    LGSIL (low grade squamous intraepithelial  dysplasia) 07/2013, 05/2015, 09/2018, 04-02-21   Follow up colposcopy adequate/normal  ECC negative 2015   PFO (patent foramen ovale)    Patient states she has no symptoms, this was diagnosis on echocardiogram   Restless leg     Past Surgical History:  Procedure Laterality Date   CESAREAN SECTION     COLPOSCOPY     CYSTOSCOPY  09/15/2015   Procedure: CYSTOSCOPY;  Surgeon: Evalene SHAUNNA Organ, MD;  Location: [facility];  Service: Gynecology;;   DENTAL SURGERY     DILATATION & CURETTAGE/HYSTEROSCOPY WITH MYOSURE N/A 07/07/2015   Procedure: DILATATION &  CURETTAGE  attempted HYSTEROSCOPY ;  Surgeon: Evalene SHAUNNA Organ, MD;  Location: [facility];  Service: Gynecology;  Laterality: N/A;   DOPPLER ECHOCARDIOGRAPHY     with bubble   ENDOMETRIAL ABLATION  04/2005   NOVASURE   LAPAROSCOPIC VAGINAL HYSTERECTOMY WITH SALPINGECTOMY Bilateral 09/15/2015   Procedure: LAPAROSCOPIC ASSISTED VAGINAL HYSTERECTOMY WITH SALPINGECTOMY;  Surgeon: Evalene SHAUNNA Organ, MD;  Location: [facility];  Service: Gynecology;  Laterality: Bilateral;   UPPER GI ENDOSCOPY       Physical Exam   Physical Exam HENT:     Head: Normocephalic.     Nose: Nose normal.  Eyes:     Pupils: Pupils are equal, round, and reactive to light.  Cardiovascular:     Rate and Rhythm: Normal rate.  Pulmonary:     Effort: Pulmonary effort is normal.  Abdominal:     General: Abdomen is flat.  Musculoskeletal:     Cervical back: Normal range of motion.  Neurological:     Mental Status: Patient is alert.     Cranial Nerves: Cranial nerves 2-12 are intact.     Sensory: Sensation is intact.     Motor: Motor function is intact.     Coordination: Coordination is intact.     Results for orders placed or performed during the hospital encounter of 02/07/24  MR ANGIO HEAD WO CONTRAST   Narrative   CLINICAL DATA:  Initial evaluation for trigeminal neuralgia, laterality not specified.  EXAM: MRI HEAD WITHOUT CONTRAST  MRA HEAD WITHOUT  CONTRAST  TECHNIQUE: Multiplanar, multi-echo pulse sequences of the brain and surrounding structures were acquired without intravenous contrast. An IAC protocol was utilized. Angiographic images of the Circle of Willis were acquired using MRA technique without intravenous contrast.  COMPARISON:  Prior MRI from 02/07/2009.  FINDINGS: MRI HEAD FINDINGS  Brain: Cerebral volume within normal limits. Few scattered subcentimeter foci of FLAIR hyperintensity seen involving the supratentorial cerebral white matter, nonspecific, but most commonly related to chronic microvascular ischemic disease. Changes are minor for age.  No evidence for acute or subacute infarct. No areas of chronic cortical infarction or other insult. No acute or chronic intracranial blood products.  No mass lesion, midline shift or mass effect. No hydrocephalus or extra-axial fluid collection. Pituitary gland and suprasellar region within normal limits.  Thin section imaging through the internal auditory canals was performed. Seventh and eighth cranial nerves are seen coursing normally through the cerebellopontine angle cisterns into the internal auditory canals. No CPA angle mass. No intracanalicular mass. Inner ear structures including the vestibulae, cochlea, and semi circular canals are normal.  Vascular: Major intracranial vascular flow voids are maintained.  Skull and upper cervical spine: Craniocervical junction within normal limits. Bone marrow signal intensity normal. No scalp soft tissue abnormality.  Sinuses/Orbits: Globes orbital soft tissues within normal limits. Mild mucosal thickening about the right ethmoidal air cells. Paranasal sinuses are otherwise largely clear. No mastoid effusion.  Other: None.  MRA HEAD FINDINGS  Anterior circulation: Both internal carotid arteries are widely patent through the siphons without stenosis. 3 mm saccular aneurysm extending laterally and anteriorly from  the proximal cavernous right ICA (series 4, image 63). This is in the region of the right Meckel's cave. A1 segments patent bilaterally. Normal anterior communicating artery complex. Both ACAs widely patent without stenosis. No M1 stenosis or occlusion. No proximal MCA branch occlusion or high-grade stenosis. Distal MCA branches perfused and symmetric.  Posterior circulation: Both vertebral arteries are widely patent to the vertebrobasilar junction without stenosis.  Left vertebral artery dominant and mildly tortuous, invaginating upon the left ventral medulla (series 4, image 50). Both PICA are patent. Basilar patent without stenosis. Superior cerebral arteries patent bilaterally. Both PCAs primarily supplied via the basilar. PCAs patent without stenosis. No visible vascular impingement about either trigeminal nerve root entry zone.  Anatomic variants: As above.  IMPRESSION: MRI HEAD:  1. No acute intracranial abnormality. 2. Few scattered subcentimeter foci of T2/FLAIR hyperintensity involving the supratentorial cerebral white matter, nonspecific, but most commonly related to chronic microvascular ischemic disease. Changes are minor for age. 3. Otherwise normal brain MRI.  MRA HEAD:  1. 3 mm saccular aneurysm extending laterally and anteriorly from the proximal cavernous right ICA. This is in the region of right Meckel's cave, and could potentially contribute to right-sided trigeminal symptoms. 2. Otherwise normal intracranial MRA.   Electronically Signed   By: Morene Hoard M.D.   On: 02/09/2024 04:58   Results for orders placed or performed during the hospital encounter of 02/07/24  MR BRAIN WO CONTRAST   Narrative   CLINICAL DATA:  Initial evaluation for trigeminal neuralgia, laterality not specified.  EXAM: MRI HEAD WITHOUT CONTRAST  MRA HEAD WITHOUT CONTRAST  TECHNIQUE: Multiplanar, multi-echo pulse sequences of the brain and surrounding structures  were acquired without intravenous contrast. An IAC protocol was utilized. Angiographic images of the Circle of Willis were acquired using MRA technique without intravenous contrast.  COMPARISON:  Prior MRI from 02/07/2009.  FINDINGS: MRI HEAD FINDINGS  Brain: Cerebral volume within normal limits. Few scattered subcentimeter foci of FLAIR hyperintensity seen involving the supratentorial cerebral white matter, nonspecific, but most commonly related to chronic microvascular ischemic disease. Changes are minor for age.  No evidence for acute or subacute infarct. No areas of chronic cortical infarction or other insult. No acute or chronic intracranial blood products.  No mass lesion, midline shift or mass effect. No hydrocephalus or extra-axial fluid collection. Pituitary gland and suprasellar region within normal limits.  Thin section imaging through the internal auditory canals was performed. Seventh and eighth cranial nerves are seen coursing normally through the cerebellopontine angle cisterns into the internal auditory canals. No CPA angle mass. No intracanalicular mass. Inner ear structures including the vestibulae, cochlea, and semi circular canals are normal.  Vascular: Major intracranial vascular flow voids are maintained.  Skull and upper cervical spine: Craniocervical junction within normal limits. Bone marrow signal intensity normal. No scalp soft tissue abnormality.  Sinuses/Orbits: Globes orbital soft tissues within normal limits. Mild mucosal thickening about the right ethmoidal air cells. Paranasal sinuses are otherwise largely clear. No mastoid effusion.  Other: None.  MRA HEAD FINDINGS  Anterior circulation: Both internal carotid arteries are widely patent through the siphons without stenosis. 3 mm saccular aneurysm extending laterally and anteriorly from the proximal cavernous right ICA (series 4, image 63). This is in the region of the right Meckel's  cave. A1 segments patent bilaterally. Normal anterior communicating artery complex. Both ACAs widely patent without stenosis. No M1 stenosis or occlusion. No proximal MCA branch occlusion or high-grade stenosis. Distal MCA branches perfused and symmetric.  Posterior circulation: Both vertebral arteries are widely patent to the vertebrobasilar junction without stenosis. Left vertebral artery dominant and mildly tortuous, invaginating upon the left ventral medulla (series 4, image 50). Both PICA are patent. Basilar patent without stenosis. Superior cerebral arteries patent bilaterally. Both PCAs primarily supplied via the basilar. PCAs patent without stenosis. No visible vascular impingement about either trigeminal nerve root entry zone.  Anatomic variants: As  above.  IMPRESSION: MRI HEAD:  1. No acute intracranial abnormality. 2. Few scattered subcentimeter foci of T2/FLAIR hyperintensity involving the supratentorial cerebral white matter, nonspecific, but most commonly related to chronic microvascular ischemic disease. Changes are minor for age. 3. Otherwise normal brain MRI.  MRA HEAD:  1. 3 mm saccular aneurysm extending laterally and anteriorly from the proximal cavernous right ICA. This is in the region of right Meckel's cave, and could potentially contribute to right-sided trigeminal symptoms. 2. Otherwise normal intracranial MRA.   Electronically Signed   By: Morene Hoard M.D.   On: 02/09/2024 04:58

## 2024-07-19 ENCOUNTER — Ambulatory Visit: Admitting: Neurosurgery
